# Patient Record
Sex: Male | Born: 1985 | Race: White | Hispanic: No | Marital: Single | Smoking: Never smoker
Health system: Southern US, Community
[De-identification: ages and names within clinical notes are randomized; demographics above are authoritative.]

---
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
## 2007-12-09 IMAGING — CR DG HAND COMPLETE 3+V*L*
3 series · 3 of 3 positions shown · non-contrast
Comparison: none

CLINICAL DATA: Injury.  Pain metacarpal region.
 LEFT HAND ? 3 VIEW:

[w hand pa left]
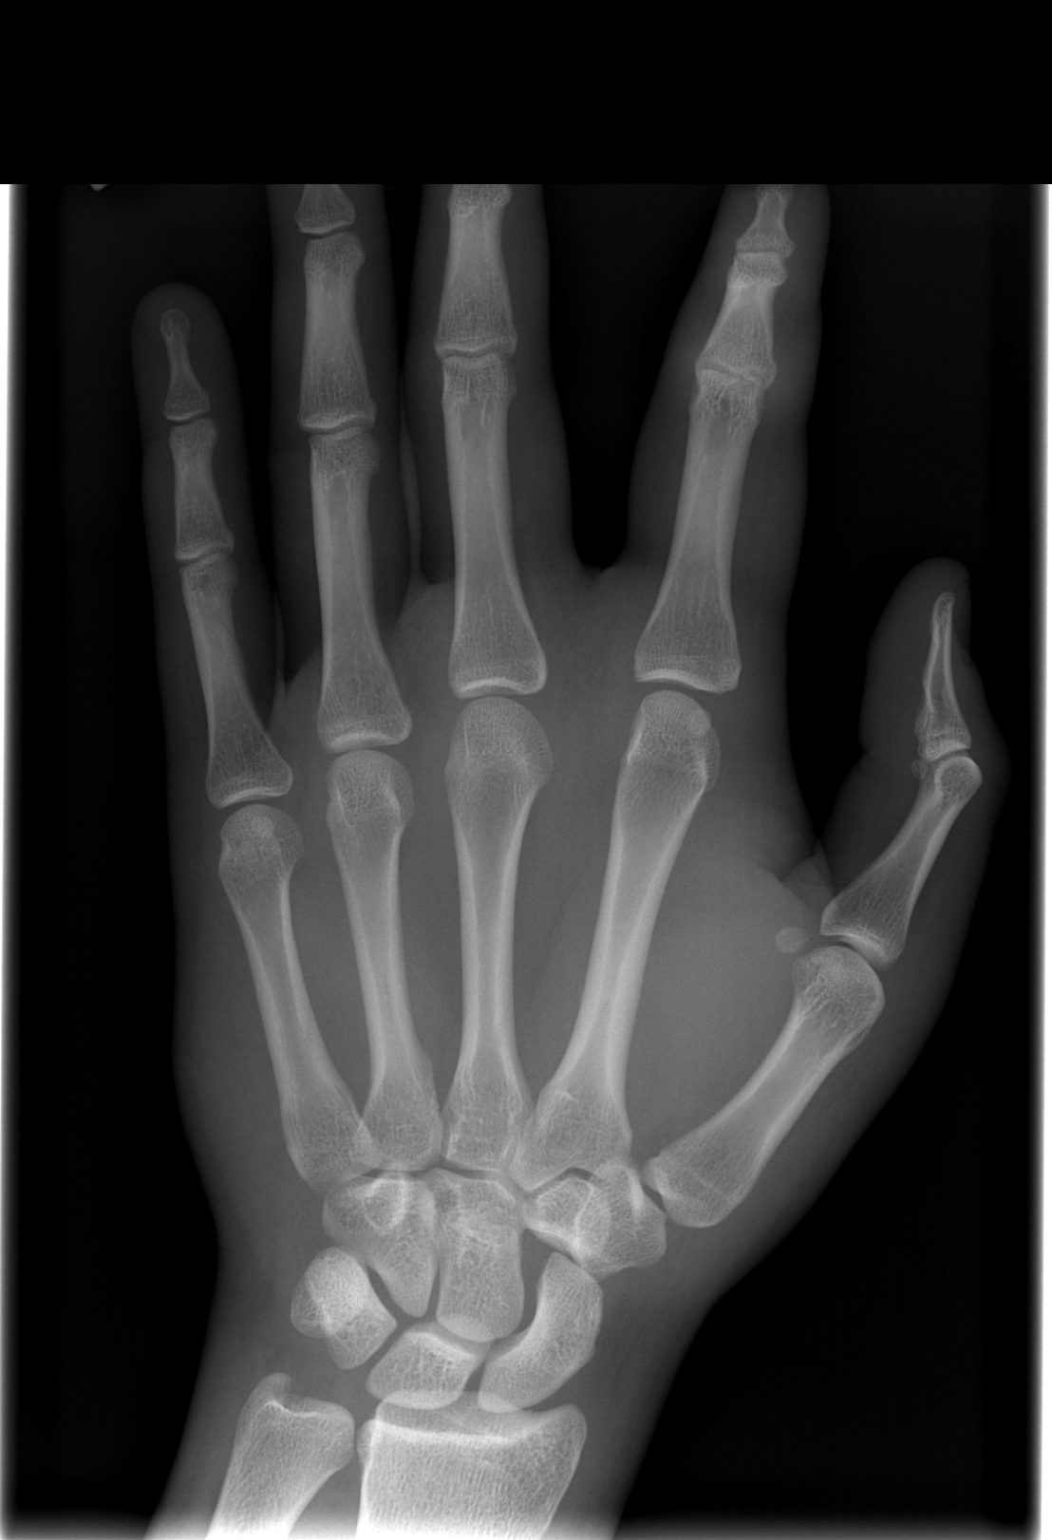

[w hand oblique left]
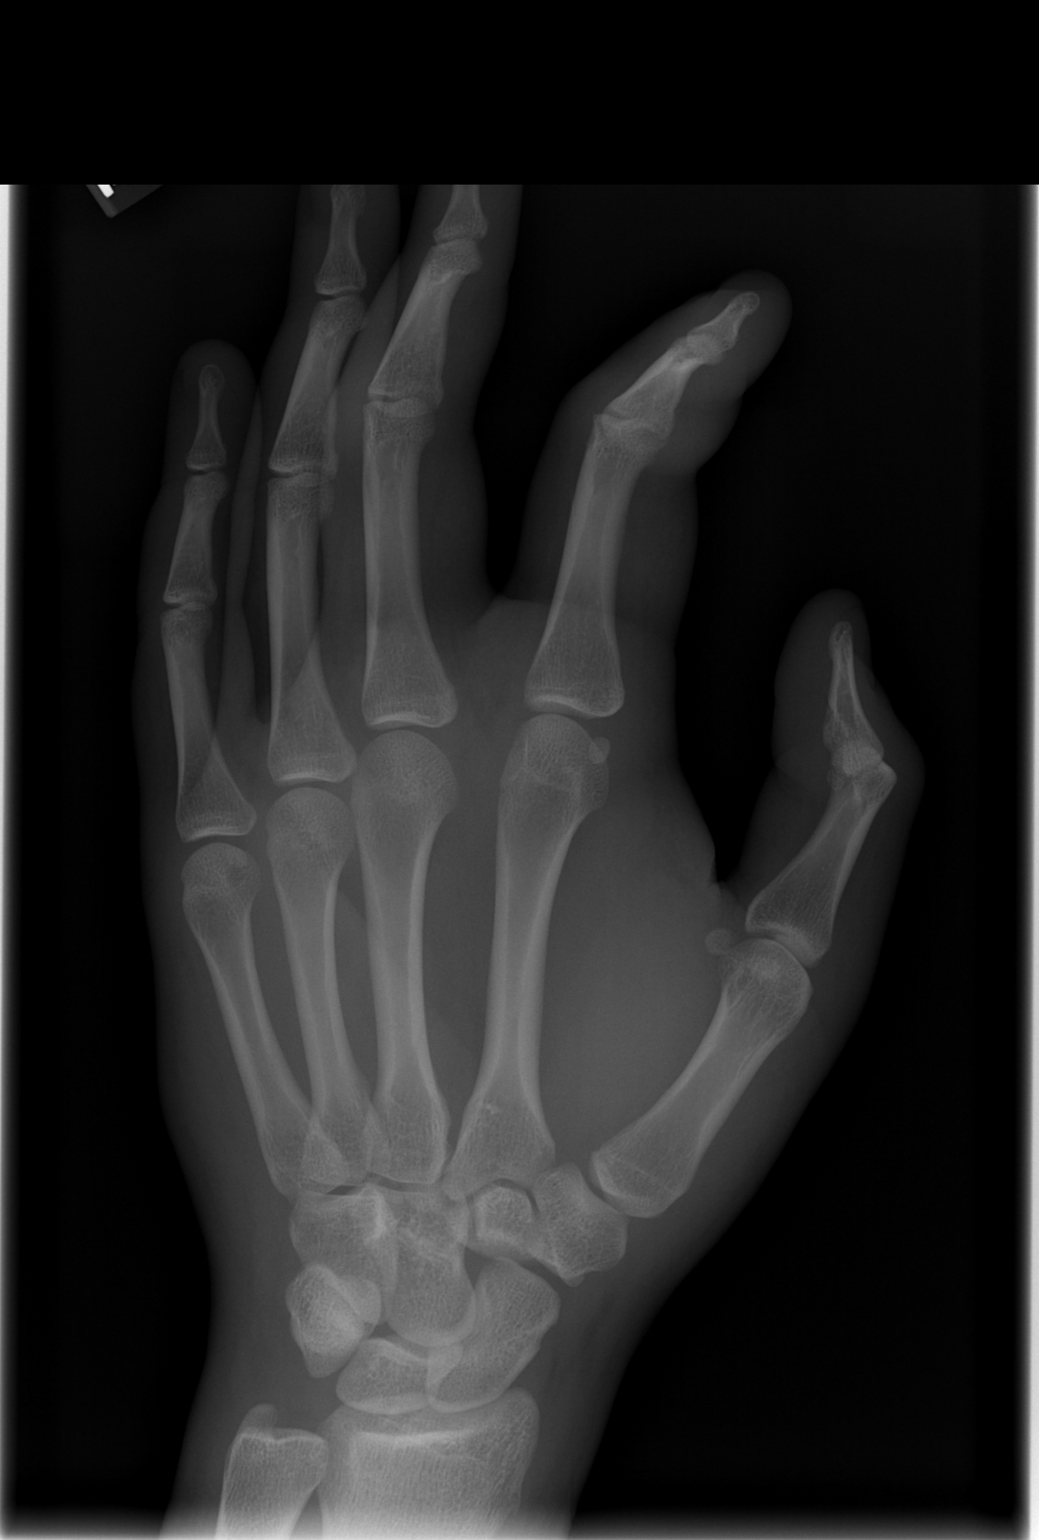

[w hand lat left]
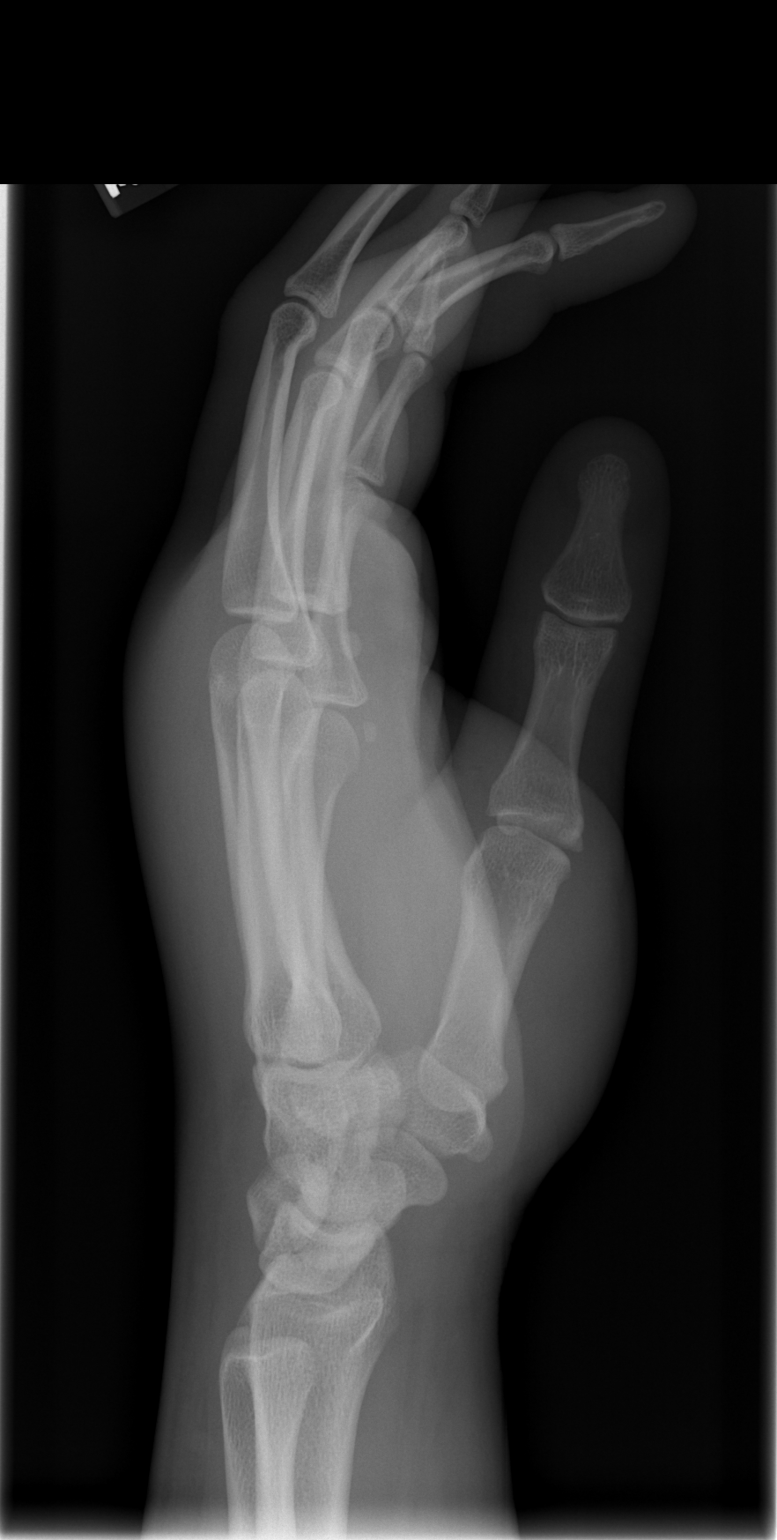

[3 of 3 positions shown; findings below may reference images not displayed]

FINDINGS: Soft tissue swelling.  No fracture or bony displacement.
IMPRESSION: No acute bony abnormality.

## 2010-09-29 ENCOUNTER — Ambulatory Visit (HOSPITAL_COMMUNITY): Payer: Self-pay | Admitting: Physician Assistant

## 2010-10-06 ENCOUNTER — Encounter (HOSPITAL_COMMUNITY): Payer: BC Managed Care – PPO | Admitting: Physician Assistant

## 2010-10-20 ENCOUNTER — Ambulatory Visit (HOSPITAL_COMMUNITY): Payer: BC Managed Care – PPO | Admitting: Physician Assistant

## 2013-09-18 ENCOUNTER — Encounter (INDEPENDENT_AMBULATORY_CARE_PROVIDER_SITE_OTHER): Payer: Self-pay

## 2013-09-18 ENCOUNTER — Encounter (HOSPITAL_COMMUNITY): Payer: Self-pay | Admitting: Psychiatry

## 2013-09-18 ENCOUNTER — Ambulatory Visit (INDEPENDENT_AMBULATORY_CARE_PROVIDER_SITE_OTHER): Payer: BC Managed Care – PPO | Admitting: Psychiatry

## 2013-09-18 VITALS — BP 129/77 | HR 77 | Ht 72.0 in | Wt 235.2 lb

## 2013-09-18 DIAGNOSIS — F3289 Other specified depressive episodes: Secondary | ICD-10-CM

## 2013-09-18 DIAGNOSIS — F329 Major depressive disorder, single episode, unspecified: Secondary | ICD-10-CM

## 2013-09-18 DIAGNOSIS — F988 Other specified behavioral and emotional disorders with onset usually occurring in childhood and adolescence: Secondary | ICD-10-CM

## 2013-09-18 DIAGNOSIS — Z79899 Other long term (current) drug therapy: Secondary | ICD-10-CM

## 2013-09-18 MED ORDER — METHYLPHENIDATE HCL ER (OSM) 18 MG PO TBCR: 18.0000 mg | EXTENDED_RELEASE_TABLET | Freq: Every day | ORAL | Status: DC

## 2013-09-18 NOTE — Progress Notes (Addendum)
[facility] Initial Assessment Note  Nicholas Sanders [id number] 28 y.o.  09/18/2013 3:49 PM  Chief Complaint:  I want to go back on my ADD medication.  I have a lot of difficulty at my work.  I used to take Concerta.  History of Present Illness:  Patient is a 28 year old Caucasian single employed man who came with his mother for his initial evaluation.  The patient has history of depression and ADD.  He is taking antidepressant for more than 4 years.  He endorsed that he has established diagnosis of ADD in high school and he was given Concerta after Vyvanse failed to improve the symptoms.  The patient recently started a new job 4 months ago.  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  He is concerned because he is unable to do multitasking .  He endorsed poor attention, poor concentration and unable to focus.  He is unable to finish his job on time.  Patient wanted to restart his Concerta.  He is also taking Prozac 60 mg as prescribed by his previous psychiatrist when he was living in [place].  Lately his Prozac as filled by his primary care physician at [facility] .  The patient had tried multiple antidepressant however he had a good response with Prozac.  He is tolerating Prozac without any side effects.  Patient denies irritability, anger, mood swings.  He denies any crying spells, feeling of hopelessness or worthlessness.  Denies any active or passive suicidal thoughts homicidal thought.  He denies any hallucination or any paranoia.  He is sleeping good.  He denies any mania, anger or any psychosis.  He admitted drinking beer sometime and smoked marijuana a few times a week.  She denies any history of physical, sexual, or bone or emotional abuse.  He denies any nightmares, flashbacks or any panic attack.  His vitals are stable.  He lives with his mother .  He has one older sister who lives in [place].  Patient is single.  Currently he is not in any relationship.  Beside  Prozac 60 mg she is not taking any medication.  Suicidal Ideation: No Plan Formed: No Patient has means to carry out plan: No  Homicidal Ideation: No Plan Formed: No Patient has means to carry out plan: No  Past Psychiatric History/Hospitalization(s) Patient endorses history of depression and ADD when he was in the school.  He did not finish college because of depression.  He likes Prozac .  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  He is diagnosed with ADD in high school.  He had tried Vyvanse however he did not have good response.  Patient has a history of mania, psychosis, hallucination, PTSD or any OCD symptoms.  His previous psychiatrist was in [place] however he has not seen more than a year.  He is getting Prozac 60 mg from his primary care physician. Anxiety: No Bipolar Disorder: No Depression: Yes Mania: No Psychosis: No Schizophrenia: No Personality Disorder: No Hospitalization for psychiatric illness: No History of Electroconvulsive Shock Therapy: No Prior Suicide Attempts: No  Medical History; The patient has no active medical problems.  He goes to [facility] for his health needs.  Traumatic brain injury: Patient denies any history of traumatic brain injury.  Family History; Patient endorse mother, father and sister has ADD.  His mother has alcohol addiction.  His uncle  has alcohol problem.  Education and Work History; Patient did not finish college because of depression.  He has worked in [employer] but did not like the third shift.  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  Psychosocial History; Patient was born in [place].  His parents are divorced.  He has one sister who lives in [place].  The patient has no contact with his father.  Patient lives with his mother.  Currently he is not in any relationship.  He has no children.  Legal  History; Patient denies any legal history.  History Of Abuse; Patient denies any history of physical, sexual, verbal or emotional abuse.  Substance Abuse History; Patient endorsed history of smoking marijuana and occasional drinking.  He denies any binge drinking, intoxication, withdrawals or any blackouts.   Review of Systems: Psychiatric: Agitation: No Hallucination: No Depressed Mood: Yes Insomnia: No Hypersomnia: No Altered Concentration: No Feels Worthless: No Grandiose Ideas: No Belief In Special Powers: No New/Increased Substance Abuse: History of smoking marijuana. Compulsions: No  Neurologic: Headache: No Seizure: No Paresthesias: No    Outpatient Encounter Prescriptions as of 09/18/2013  Medication Sig  . FLUoxetine (PROZAC) 20 MG capsule Take 60 mg by mouth daily.  . methylphenidate (CONCERTA) 18 MG PO CR tablet Take 1 tablet (18 mg total) by mouth daily.    No results found for this or any previous visit (from the past 2160 hour(s)).    Physical Exam: Constitutional:  BP 129/77  Pulse 77  Ht 6' (1.829 m)  Wt 235 lb 3.2 oz (106.686 kg)  BMI 31.89 kg/m2  Musculoskeletal: Strength & Muscle Tone: within normal limits Gait & Station: normal Patient leans: N/A  Mental Status Examination;  Patient is casually dressed and groomed.  He maintains fair eye contact.  He appears shy but cooperative.  His speech is slow, clear and coherent.  His thought process is logical and goal-directed.  He described his mood is neutral and his affect is mood appropriate.  His attention and concentration is fair.  He denies any auditory or visual hallucination.  Denies any active or passive suicidal thoughts and homicidal thought.  There were no flight of ideas or any loose association.  There were no delusions, paranoia or any obsessive thoughts.  Psychomotor activity is slightly decreased.  His fund of knowledge is average.  There were no tremors or shakes.  He is alert and  oriented x3.  His insight judgment and impulse control is okay.   New problem, with additional work up planned, Review of Psycho-Social Stressors (1), Review or order clinical lab tests (1), Decision to obtain old records (1), Review and summation of old records (2), Established Problem, Worsening (2), Review of Medication Regimen & Side Effects (2) and Review of New Medication or Change in Dosage (2)  Assessment: Axis I: Depressive disorder NOS, ADD  Axis II: Deferred  Axis III:  History reviewed. No pertinent past medical history.  Axis IV: Mild to moderate    Plan:  I reviewed the symptoms, collateral information, stressors, his current medication.  Patient has a good response with Concerta in the past.  He is taking Prozac 60 mg as prescribed by his previous psychiatrist that he was in [place].  We will get collateral information from his previous psychiatrist.  I discussed about his marijuana use and drinking.  Patient is willing to stop smoking marijuana and drinking alcohol .  He also get better and he wants to keep his job safe .  I also discuss starting Wellbutrin that can help depression and ADD symptoms the patient does not want to change his Prozac since he had tried other antidepressants with limited response.  He has enough refills of Prozac which is prescribed lately by his primary care physician at [facility].  I will try low dose Concerta 18 mg daily.  Discuss in detail the risks and benefits of medication.  Explained about weight loss, chest pain, palpitation and insomnia.  Discussed about stopping marijuana and alcohol .  I would also do blood work says he has no blood work in a while.  He will order CBC, CMP, and latency and lipid panel.  At this time patient does not require any counseling.  I will see him again in 2-3 weeks.  Recommended to call us back if he has any question or any concern.  Time spent 55 minutes.  More than 50% of the time spent in  psychoeducation, counseling and coordination of care.  Discuss safety plan that anytime having active suicidal thoughts or homicidal thoughts then patient need to call 911 or go to the local emergency room.   Keyanna Sandefer T., MD 09/18/2013

## 2013-10-08 LAB — CBC WITH DIFFERENTIAL/PLATELET
BASOS ABS: 0.1 10*3/uL (ref 0.0–0.1)
Basophils Relative: 1 % (ref 0–1)
Eosinophils Absolute: 0.2 10*3/uL (ref 0.0–0.7)
Eosinophils Relative: 2 % (ref 0–5)
HEMATOCRIT: 46.4 % (ref 39.0–52.0)
Hemoglobin: 16.5 g/dL (ref 13.0–17.0)
Lymphocytes Relative: 40 % (ref 12–46)
Lymphs Abs: 3.3 10*3/uL (ref 0.7–4.0)
MCH: 31.9 pg (ref 26.0–34.0)
MCHC: 35.6 g/dL (ref 30.0–36.0)
MCV: 89.6 fL (ref 78.0–100.0)
MONOS PCT: 6 % (ref 3–12)
Monocytes Absolute: 0.5 10*3/uL (ref 0.1–1.0)
NEUTROS ABS: 4.2 10*3/uL (ref 1.7–7.7)
NEUTROS PCT: 51 % (ref 43–77)
PLATELETS: 296 10*3/uL (ref 150–400)
RBC: 5.18 MIL/uL (ref 4.22–5.81)
RDW: 13.1 % (ref 11.5–15.5)
WBC: 8.3 10*3/uL (ref 4.0–10.5)

## 2013-10-09 ENCOUNTER — Ambulatory Visit (HOSPITAL_COMMUNITY): Payer: Self-pay | Admitting: Psychiatry

## 2013-10-09 LAB — COMPREHENSIVE METABOLIC PANEL
ALT: 31 U/L (ref 0–53)
AST: 25 U/L (ref 0–37)
Albumin: 5.2 g/dL (ref 3.5–5.2)
Alkaline Phosphatase: 51 U/L (ref 39–117)
BUN: 12 mg/dL (ref 6–23)
CHLORIDE: 102 meq/L (ref 96–112)
CO2: 31 meq/L (ref 19–32)
Calcium: 10.2 mg/dL (ref 8.4–10.5)
Creat: 0.87 mg/dL (ref 0.50–1.35)
GLUCOSE: 75 mg/dL (ref 70–99)
POTASSIUM: 4.1 meq/L (ref 3.5–5.3)
SODIUM: 140 meq/L (ref 135–145)
Total Bilirubin: 0.5 mg/dL (ref 0.2–1.2)
Total Protein: 7.6 g/dL (ref 6.0–8.3)

## 2013-10-09 LAB — LIPID PANEL
CHOL/HDL RATIO: 3.8 ratio
Cholesterol: 200 mg/dL (ref 0–200)
HDL: 53 mg/dL (ref >39)
LDL Cholesterol: 120 mg/dL — ABNORMAL HIGH (ref 0–99)
Triglycerides: 137 mg/dL (ref <150)
VLDL: 27 mg/dL (ref 0–40)

## 2013-10-09 LAB — HEMOGLOBIN A1C
HEMOGLOBIN A1C: 5.5 % (ref <5.7)
Mean Plasma Glucose: 111 mg/dL (ref <117)

## 2013-10-22 ENCOUNTER — Telehealth (HOSPITAL_COMMUNITY): Payer: Self-pay

## 2013-10-22 ENCOUNTER — Telehealth (HOSPITAL_COMMUNITY): Payer: Self-pay | Admitting: *Deleted

## 2013-10-22 ENCOUNTER — Other Ambulatory Visit (HOSPITAL_COMMUNITY): Payer: Self-pay | Admitting: Psychiatry

## 2013-10-22 DIAGNOSIS — F988 Other specified behavioral and emotional disorders with onset usually occurring in childhood and adolescence: Secondary | ICD-10-CM

## 2013-10-22 MED ORDER — METHYLPHENIDATE HCL ER (OSM) 18 MG PO TBCR: 18.0000 mg | EXTENDED_RELEASE_TABLET | Freq: Every day | ORAL | Status: DC

## 2013-10-22 NOTE — Progress Notes (Signed)
Provide one time prescription of stimulant.  Patient needs to be seen for future refills.

## 2013-10-22 NOTE — Telephone Encounter (Signed)
Requested refill of Concerta. Advised pt that request will be sent to [facility] Encouraged pt to make appt - only one appt 8/6 without follow up Patient stated he came on 8/27, but office had closed early due to an emergency. States he will call today

## 2013-11-17 ENCOUNTER — Encounter (HOSPITAL_COMMUNITY): Payer: Self-pay | Admitting: Psychiatry

## 2013-11-17 ENCOUNTER — Ambulatory Visit (INDEPENDENT_AMBULATORY_CARE_PROVIDER_SITE_OTHER): Payer: BC Managed Care – PPO | Admitting: Psychiatry

## 2013-11-17 VITALS — BP 139/80 | HR 85 | Ht 72.0 in | Wt 248.0 lb

## 2013-11-17 DIAGNOSIS — F988 Other specified behavioral and emotional disorders with onset usually occurring in childhood and adolescence: Secondary | ICD-10-CM

## 2013-11-17 DIAGNOSIS — F329 Major depressive disorder, single episode, unspecified: Secondary | ICD-10-CM

## 2013-11-17 DIAGNOSIS — F909 Attention-deficit hyperactivity disorder, unspecified type: Secondary | ICD-10-CM

## 2013-11-17 MED ORDER — METHYLPHENIDATE HCL ER (OSM) 36 MG PO TBCR: 36.0000 mg | EXTENDED_RELEASE_TABLET | Freq: Every day | ORAL | Status: DC

## 2013-11-17 NOTE — Progress Notes (Signed)
[facility] Progress Note   Nicholas Sanders [id number] 27 y.o.  11/17/2013 4:11 PM  Chief Complaint:  I'm taking 2 tablets of Concerta because one is not helping my focus.  I was given warning at work because I am neglecting my job.   History of Present Illness:  Nicholas Sanders came for her followup appointment.  He is a 28 year old Caucasian single employed man who was seen as a need to evaluation on August 6.  He has a history of ADHD.  He was started on Concerta 18 mg.  After 2 weeks he felt it is not helping him and he started to take 2 tablets every day.  He admitted using his mother's Concerta .  He was very concerned because he was neglecting and he was unable to do multitasking at work.  He was given warning from the job .  Since he is taking Concerta 36 mg his focus and attention is good.  He is able to finish his tasks on time.  He is able to do multitasking.  His sleep is good.  Denies any irritability, anger, mood swing.  He denies any chest pain or any tremors.  He cut down his drinking and he has smoked marijuana only once since he was seen last time.  His energy level is good.  Denies any crying spells, paranoia or any hallucination.  He has a blood work which is normal except for mild elevation of LDL.  He is taking Prozac 60 mg as prescribed by his previous psychiatrist and he has enough refills remaining.  His appetite is okay.  His vitals are stable.  He lives with his mother.  He likes his job.  He is working at [employer]   Suicidal Ideation: No Plan Formed: No Patient has means to carry out plan: No  Homicidal Ideation: No Plan Formed: No Patient has means to carry out plan: No  Past Psychiatric History/Hospitalization(s) Patient endorses history of depression and ADD when he was in the school.  He did not finish college because of depression.  He likes Prozac .  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  He is diagnosed with ADD in high school.  He had tried Vyvanse however he did not have good response.  Patient has a history of mania, psychosis, hallucination, PTSD or any OCD symptoms.  His previous psychiatrist was in [place] however he has not seen more than a year.  He is getting Prozac 60 mg from his primary care physician. Anxiety: No Bipolar Disorder: No Depression: Yes Mania: No Psychosis: No Schizophrenia: No Personality Disorder: No Hospitalization for psychiatric illness: No History of Electroconvulsive Shock Therapy: No Prior Suicide Attempts: No  Medical History; Patient has no active medical problems.  He goes to [facility] for his [facility] needs.  Review of Systems  Constitutional: Negative.   Musculoskeletal: Negative.   Skin: Negative.   Neurological: Negative.    Psychiatric: Agitation: No Hallucination: No Depressed Mood: No Insomnia: No Hypersomnia: No Altered Concentration: No Feels Worthless: No Grandiose Ideas: No Belief In Special Powers: No New/Increased Substance Abuse: History of smoking marijuana. Compulsions: No  Neurologic: Headache: No Seizure: No Paresthesias: No    Outpatient Encounter Prescriptions as of 11/17/2013  Medication Sig  . FLUoxetine (PROZAC) 20 MG capsule Take 60 mg by mouth daily.  . methylphenidate 36 MG PO CR tablet  Take 1 tablet (36 mg total) by mouth daily.  . [DISCONTINUED] methylphenidate (CONCERTA) 18 MG PO CR tablet Take 1 tablet (18 mg total) by mouth daily.  . [DISCONTINUED] methylphenidate (CONCERTA) 36 MG PO CR tablet Take 1 tablet (36 mg total) by mouth daily.    Recent Results (from the past 2160 hour(s))  HEMOGLOBIN A1C     Status: None   Collection Time    10/08/13  5:16 PM      Result Value Ref Range   Hemoglobin A1C 5.5  <5.7 %   Comment:                                                                            According to the ADA Clinical  Practice Recommendations for 2011, when     HbA1c is used as a screening test:             >=6.5%   Diagnostic of Diabetes Mellitus                (if abnormal result is confirmed)           5.7-6.4%   Increased risk of developing Diabetes Mellitus           References:Diagnosis and Classification of Diabetes Mellitus,Diabetes     YDXA,1287,86([facility] 1):S62-S69 and Standards of Medical Care in             Diabetes - 2011,Diabetes Care,2011,34 (Suppl 1):S11-S61.         Mean Plasma Glucose 111  <117 mg/dL  LIPID PANEL     Status: Abnormal   Collection Time    10/08/13  5:16 PM      Result Value Ref Range   Cholesterol 200  0 - 200 mg/dL   Comment: ATP III Classification:           < 200        mg/dL        Desirable          200 - 239     mg/dL        Borderline High          >= 240        mg/dL        High         Triglycerides 137  <150 mg/dL   HDL 53  >39 mg/dL   Total CHOL/HDL Ratio 3.8     VLDL 27  0 - 40 mg/dL   LDL Cholesterol 120 (*) 0 - 99 mg/dL   Comment:       Total Cholesterol/HDL Ratio:CHD Risk                            Coronary Heart Disease Risk Table                                            Men       Women              1/2 Average Risk  3.4        3.3                  Average Risk              5.0        4.4               2X Average Risk              9.6        7.1               3X Average Risk             23.4       11.0     Use the calculated Patient Ratio above and the CHD Risk table      to determine the patient's CHD Risk.     ATP III Classification (LDL):           < 100        mg/dL         Optimal          100 - 129     mg/dL         Near or Above Optimal          130 - 159     mg/dL         Borderline High          160 - 189     mg/dL         High           > 190        mg/dL         Very High        CBC WITH DIFFERENTIAL     Status: None   Collection Time    10/08/13  5:16 PM      Result Value Ref Range   WBC 8.3  4.0 - 10.5 K/uL    RBC 5.18  4.22 - 5.81 MIL/uL   Hemoglobin 16.5  13.0 - 17.0 g/dL   HCT 46.4  39.0 - 52.0 %   MCV 89.6  78.0 - 100.0 fL   MCH 31.9  26.0 - 34.0 pg   MCHC 35.6  30.0 - 36.0 g/dL   RDW 13.1  11.5 - 15.5 %   Platelets 296  150 - 400 K/uL   Neutrophils Relative % 51  43 - 77 %   Neutro Abs 4.2  1.7 - 7.7 K/uL   Lymphocytes Relative 40  12 - 46 %   Lymphs Abs 3.3  0.7 - 4.0 K/uL   Monocytes Relative 6  3 - 12 %   Monocytes Absolute 0.5  0.1 - 1.0 K/uL   Eosinophils Relative 2  0 - 5 %   Eosinophils Absolute 0.2  0.0 - 0.7 K/uL   Basophils Relative 1  0 - 1 %   Basophils Absolute 0.1  0.0 - 0.1 K/uL   Smear Review Criteria for review not met    COMPREHENSIVE METABOLIC PANEL     Status: None   Collection Time    10/08/13  5:16 PM      Result Value Ref Range   Sodium 140  135 - 145 mEq/L   Potassium 4.1  3.5 - 5.3 mEq/L   Chloride 102  96 - 112 mEq/L   CO2 31  19 - 32 mEq/L   Glucose,  Bld 75  70 - 99 mg/dL   BUN 12  6 - 23 mg/dL   Creat 0.87  0.50 - 1.35 mg/dL   Total Bilirubin 0.5  0.2 - 1.2 mg/dL   Alkaline Phosphatase 51  39 - 117 U/L   AST 25  0 - 37 U/L   ALT 31  0 - 53 U/L   Total Protein 7.6  6.0 - 8.3 g/dL   Albumin 5.2  3.5 - 5.2 g/dL   Calcium 10.2  8.4 - 10.5 mg/dL      Physical Exam: Constitutional:  BP 139/80  Pulse 85  Ht 6' (1.829 m)  Wt 248 lb (112.492 kg)  BMI 33.63 kg/m2  Musculoskeletal: Strength & Muscle Tone: within normal limits Gait & Station: normal Patient leans: N/A  Mental Status Examination;  Patient is casually dressed and groomed.  He maintains fair eye contact.  He appears shy but cooperative.  His speech is slow, clear and coherent.  His thought process is logical and goal-directed.  He described his mood is neutral and his affect is mood appropriate.  His attention and concentration is fair.  He denies any auditory or visual hallucination.  Denies any active or passive suicidal thoughts and homicidal thought.  There were no flight of  ideas or any loose association.  There were no delusions, paranoia or any obsessive thoughts.  Psychomotor activity is slightly decreased.  His fund of knowledge is average.  There were no tremors or shakes.  He is alert and oriented x3.  His insight judgment and impulse control is okay.   Review of Psycho-Social Stressors (1), Review or order clinical lab tests (1), Review of Last Therapy Session (1), Review of Medication Regimen & Side Effects (2) and Review of New Medication or Change in Dosage (2)  Assessment: Axis I: Depressive disorder NOS, ADD  Axis II: Deferred  Axis III:  History reviewed. No pertinent past medical history.  Axis IV: Mild to moderate    Plan:  I discussed medication, labs results in detail.  Recommended not to use someone's medication.  He is doing better on Concerta 36 mg.  I will continue Concerta 36 mg daily.  Patient stopped drinking and has cut down his marijuana use.  His attention and focus is improved from the past.  Continue Prozac 60 mg daily.  Patient has 3 children maintenance.  We are still waiting collateral information from his previous psychiatrist and medical doctor from [facility].  Recommended to call us back if he has any question or any concern.  Followup in 2 months. Time spent 25 minutes.  More than 50% of the time spent in psychoeducation, counseling and coordination of care.  Discuss safety plan that anytime having active suicidal thoughts or homicidal thoughts then patient need to call 911 or go to the local emergency room.   ARFEEN,SYED T., MD 11/17/2013

## 2014-01-19 ENCOUNTER — Ambulatory Visit (HOSPITAL_COMMUNITY): Payer: Self-pay | Admitting: Psychiatry

## 2014-03-11 ENCOUNTER — Telehealth (HOSPITAL_COMMUNITY): Payer: Self-pay | Admitting: *Deleted

## 2014-03-11 NOTE — Telephone Encounter (Signed)
Patient called needing refill on Concerta. Patient no showed appointment in December. Made patient follow up appointment for 03-16-2014 at 1045 am. Patient stated he has enough medication for a week as of today, so next Wednesday he will be out(03-18-2014). Advised patient to keep appointment on 03-16-2014 so he can receive refill on medication. Patient verbalized understanding.

## 2014-03-16 ENCOUNTER — Ambulatory Visit (INDEPENDENT_AMBULATORY_CARE_PROVIDER_SITE_OTHER): Payer: 59 | Admitting: Psychiatry

## 2014-03-16 ENCOUNTER — Encounter (HOSPITAL_COMMUNITY): Payer: Self-pay | Admitting: Psychiatry

## 2014-03-16 VITALS — BP 148/96 | HR 76 | Ht 72.0 in | Wt 256.0 lb

## 2014-03-16 DIAGNOSIS — F329 Major depressive disorder, single episode, unspecified: Secondary | ICD-10-CM

## 2014-03-16 DIAGNOSIS — F909 Attention-deficit hyperactivity disorder, unspecified type: Secondary | ICD-10-CM

## 2014-03-16 DIAGNOSIS — F988 Other specified behavioral and emotional disorders with onset usually occurring in childhood and adolescence: Secondary | ICD-10-CM

## 2014-03-16 MED ORDER — METHYLPHENIDATE HCL ER (OSM) 36 MG PO TBCR: 36.0000 mg | EXTENDED_RELEASE_TABLET | Freq: Every day | ORAL | Status: DC

## 2014-03-16 NOTE — Progress Notes (Signed)
[facility] [address] Progress Note   Nicholas Sanders [id number] 29 y.o.  03/16/2014 11:37 AM  Chief Complaint:  Medication management and follow-up.     History of Present Illness:  Nicholas Sanders came for her followup appointment.  He is compliant with Concerta 36 mg.  Nicholas attention and concentration is good.  Recently he was appreciated at Nicholas work as he is more focus and able to do multitasking.  However patient is concerned about Nicholas Sanders who has history of drinking alcohol and he is concerned that she may be using Xanax .  Patient told Nicholas Sanders has attended [facility] in the past and wondering if she may need help.  Overall patient has been stable on Nicholas medication.  He denies any irritability, anger, mood swing or any crying spells.  He sleeping good.  He denies any paranoia or any hallucination.  He is taking Prozac 60 mg which is prescribed by Nicholas previous psychiatrist and he still has refill remaining.  We are still awaiting records from [facility].  He reported Nicholas appetite is okay.  However he has gained weight from the past.  Nicholas vitals are stable.  Patient lives with Nicholas Sanders.  He likes Nicholas job.  He is working at [employer].  He denies drinking or using any illegal substances.  Suicidal Ideation: No Plan Formed: No Patient has means to carry out plan: No  Homicidal Ideation: No Plan Formed: No Patient has means to carry out plan: No  Past Psychiatric History/Hospitalization(s) Patient endorses history of depression and ADD when he was in the school.  He did not finish college because of depression.  He likes Prozac .  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  He is diagnosed with ADD in high school.  He had tried Vyvanse however he did not have good response.  Patient has a history of mania, psychosis, hallucination, PTSD or any OCD symptoms.  Nicholas previous psychiatrist  was in [place] however he has not seen more than a year.  He is getting Prozac 60 mg from Nicholas primary care physician. Anxiety: No Bipolar Disorder: No Depression: Yes Mania: No Psychosis: No Schizophrenia: No Personality Disorder: No Hospitalization for psychiatric illness: No History of Electroconvulsive Shock Therapy: No Prior Suicide Attempts: No  Medical History; Patient has no active medical problems.  He goes to [facility] for Nicholas health needs.  ROS Psychiatric: Agitation: No Hallucination: No Depressed Mood: No Insomnia: No Hypersomnia: No Altered Concentration: No Feels Worthless: No Grandiose Ideas: No Belief In Special Powers: No New/Increased Substance Abuse: History of smoking marijuana. Compulsions: No  Neurologic: Headache: No Seizure: No Paresthesias: No    Outpatient Encounter Prescriptions as of 03/16/2014  Medication Sig  . FLUoxetine (PROZAC) 20 MG capsule Take 60 mg by mouth daily.  . methylphenidate 36 MG PO CR tablet Take 1 tablet (36 mg total) by mouth daily.  . [DISCONTINUED] methylphenidate 36 MG PO CR tablet Take 1 tablet (36 mg total) by mouth daily.  . [DISCONTINUED] methylphenidate 36 MG PO CR tablet Take 1 tablet (36 mg total) by mouth daily.    No results found for this or any previous visit (from the past 2160 hour(s)).    Physical Exam: Constitutional:  BP 148/96 mmHg  Pulse 76  Ht 6' (1.829 m)  Wt 256 lb (116.121 kg)  BMI 34.71 kg/m2  Musculoskeletal: Strength & Muscle Tone: within normal limits Gait & Station: normal Patient leans: N/A  Mental Status Examination;  Patient is casually dressed and groomed.  He maintained good eye contact.  He is pleasant and cooperative.  Nicholas speech is slow, clear and coherent.  Nicholas thought process is logical and goal-directed.  He described Nicholas mood is neutral and Nicholas affect is mood appropriate.  Nicholas attention and concentration is fair.  He denies any auditory or visual  hallucination.  He denies any active or passive suicidal thoughts and homicidal thought.  There were no flight of ideas or any loose association.  There were no delusions, paranoia or any obsessive thoughts.  Psychomotor activity is slightly decreased.  Nicholas fund of knowledge is average.  There were no tremors or shakes.  He is alert and oriented x3.  Nicholas insight judgment and impulse control is okay.   Established Problem, Stable/Improving (1), Review of Psycho-Social Stressors (1), Review of Last Therapy Session (1) and Review of Medication Regimen & Side Effects (2)  Assessment: Axis I: Depressive disorder NOS, ADD  Axis II: Deferred  Axis III:  History reviewed. No pertinent past medical history.  Plan:  I recommended to have Nicholas Sanders called outpatient office to be evaluated or if patient believed that Sanders is in crisis and risk to herself or others then he should call 911 and to be evaluated in the emergency room.  I also talk about getting commitment for outpatient program if needed.  Patient knowledge.  At this time I will continue Concerta 60 mg daily .  Patient is getting Prozac 60 mg from Nicholas previous psychiatrist since he still has refill remaining however in the future he will contact us for refills.  Discussed medication side effects and benefits.  He is happy that he is no longer using drugs and marijuana.  I will see him again in 3 months.  Recommended to call us back if he has any question, concern or if he feel worsening of the symptoms.   Christobal Morado T., MD 03/16/2014

## 2014-03-19 ENCOUNTER — Telehealth (HOSPITAL_COMMUNITY): Payer: Self-pay

## 2014-03-19 NOTE — Telephone Encounter (Signed)
Prior authorization started for Concerta medication.  Fax will be sent today to complete.

## 2014-03-19 NOTE — Telephone Encounter (Signed)
Patient approved for Concerta through [facility] today from 03/19/14 - 03/20/15.  File ID#: [id number].

## 2014-03-19 NOTE — Telephone Encounter (Signed)
Prior authorization completed and faxed back to [facility].  Attempted to contact patient by phone but home listed number has been disconnected.  Could not reach patient at other numbers as was going to assess any other stimulants patient had tried and failed in the past.  By initial evaluation 09/18/13, patient did report failed attempt on Vyvanse when he was a teenager and included this on the request.  Prior authorization faxed in and will continue to contact patient for other failed medications if authorization is denied.

## 2014-04-06 ENCOUNTER — Other Ambulatory Visit (HOSPITAL_COMMUNITY): Payer: Self-pay

## 2014-04-06 DIAGNOSIS — F32A Depression, unspecified: Secondary | ICD-10-CM

## 2014-04-06 DIAGNOSIS — F329 Major depressive disorder, single episode, unspecified: Secondary | ICD-10-CM

## 2014-04-06 MED ORDER — FLUOXETINE HCL 20 MG PO CAPS: 60.0000 mg | ORAL_CAPSULE | Freq: Every day | ORAL | Status: DC

## 2014-04-06 NOTE — Telephone Encounter (Signed)
Patient called requesting an order for his Prozac 20mg , 3 a day to be sent into [facility] on [address] [facility] [address]  Patient reported past psychiatrist from [place], Dr. Nils FlackMartha Smith was no longer prescribing his Prozac and stated Dr. Lolly MustacheArfeen mentioned at his evaluation 03/16/14 he would be willing to take over this prescription.  Patient would like a call back to let him know Dr. Lolly MustacheArfeen is still in agreement to write the new prescription and is completely out as of today.

## 2014-04-06 NOTE — Telephone Encounter (Signed)
Called patient back to [facility] [address] and e-scribed in a new order, Prozac 20mg , 3 daily, #90 with 2 refills to last patient until he returns 06/15/14 and new orders authorized by Dr. Lolly MustacheArfeen with verbal order today.

## 2014-06-15 ENCOUNTER — Ambulatory Visit (INDEPENDENT_AMBULATORY_CARE_PROVIDER_SITE_OTHER): Payer: 59 | Admitting: Psychiatry

## 2014-06-15 ENCOUNTER — Encounter (HOSPITAL_COMMUNITY): Payer: Self-pay | Admitting: Psychiatry

## 2014-06-15 VITALS — BP 126/86 | HR 71 | Ht 72.0 in | Wt 256.8 lb

## 2014-06-15 DIAGNOSIS — F329 Major depressive disorder, single episode, unspecified: Secondary | ICD-10-CM

## 2014-06-15 DIAGNOSIS — F909 Attention-deficit hyperactivity disorder, unspecified type: Secondary | ICD-10-CM

## 2014-06-15 DIAGNOSIS — F988 Other specified behavioral and emotional disorders with onset usually occurring in childhood and adolescence: Secondary | ICD-10-CM

## 2014-06-15 DIAGNOSIS — F32A Depression, unspecified: Secondary | ICD-10-CM

## 2014-06-15 MED ORDER — METHYLPHENIDATE HCL ER (OSM) 36 MG PO TBCR: 36.0000 mg | EXTENDED_RELEASE_TABLET | Freq: Every day | ORAL | Status: DC

## 2014-06-15 MED ORDER — FLUOXETINE HCL 20 MG PO CAPS: 60.0000 mg | ORAL_CAPSULE | Freq: Every day | ORAL | Status: DC

## 2014-06-15 NOTE — Progress Notes (Signed)
[facility] [address] Progress Note   Nicholas Sanders [id number] 29 y.o.  06/15/2014 4:53 PM  Chief Complaint:  Medication management and follow-up.     History of Present Illness:  Nicholas Sanders came for her followup appointment.   He is very happy because he got promoted at his work.  He is taking his medication and he has no side effects.  He sleeping good.  He denies any irritability, anger, mood swing or any shakes or tremors.  He is happy that his mother is not drinking alcohol anymore.  His been very busy at work.  His appetite is okay.  His vitals are stable.  He denies any feeling of hopelessness or worthlessness.  He is taking Concerta 36 mg daily.  He is taking Prozac 60 mg daily.  Patient lives with his mother.  He likes his job.  He is working at [employer].  He denies drinking or using any illegal substances.  Suicidal Ideation: No Plan Formed: No Patient has means to carry out plan: No  Homicidal Ideation: No Plan Formed: No Patient has means to carry out plan: No  Past Psychiatric History/Hospitalization(s) Patient endorses history of depression and ADD when he was in the school.  He did not finish college because of depression.   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  He is diagnosed with ADD in high school.  He had tried Vyvanse however he did not have good response.  Patient denies history of mania, psychosis, hallucination, PTSD or any OCD symptoms.   He had a good response with Prozac. Anxiety: No Bipolar Disorder: No Depression: Yes Mania: No Psychosis: No Schizophrenia: No Personality Disorder: No Hospitalization for psychiatric illness: No History of Electroconvulsive Shock Therapy: No Prior Suicide Attempts: No  Medical History; Patient has no active medical problems.  He goes [facility] his health needs.  ROS Psychiatric: Agitation:  No Hallucination: No Depressed Mood: No Insomnia: No Hypersomnia: No Altered Concentration: No Feels Worthless: No Grandiose Ideas: No Belief In Special Powers: No New/Increased Substance Abuse: History of smoking marijuana. Compulsions: No  Neurologic: Headache: No Seizure: No Paresthesias: No    Outpatient Encounter Prescriptions as of 06/15/2014  . Order #: [id number] Class: Normal  . Order #: [id number] Class: Print  . [DISCONTINUED] Order #: [id number] Class: Normal  . [DISCONTINUED] Order #: [id number] Class: Print  . [DISCONTINUED] Order #: [id number] Class: Print    No results found for this or any previous visit (from the past 2160 hour(s)).    Physical Exam: Constitutional:  BP 126/86 mmHg  Pulse 71  Ht 6' (1.829 m)  Wt 256 lb 12.8 oz (116.484 kg)  BMI 34.82 kg/m2  Musculoskeletal: Strength & Muscle Tone: within normal limits Gait & Station: normal Patient leans: N/A  Mental Status Examination;  Patient is casually dressed and groomed.  He maintained good eye contact.  He is pleasant and cooperative.  His speech is clear and coherent.  His thought process is logical and goal-directed.  He described his mood is neutral and his affect is mood appropriate.  His attention and concentration is fair.  He denies any auditory or visual hallucination.  He denies any active or passive suicidal thoughts and homicidal thought.  There were no flight of ideas or any loose association.  There were no delusions, paranoia or any obsessive thoughts.  Psychomotor activity is  slightly decreased.  His fund of knowledge is average.  There were no tremors or shakes.  He is alert and oriented x3.  His insight judgment and impulse control is okay.   Established Problem, Stable/Improving (1), Review of Psycho-Social Stressors (1), Review of Last Therapy Session (1) and Review of Medication Regimen & Side Effects (2)  Assessment: Axis I: Depressive disorder NOS, ADD  Axis II: Deferred  Axis  III:  History reviewed. No pertinent past medical history.  Plan:  Patient is doing better on Prozac  60 mg daily and Concerta 36 mg daily.  He has no side effects.  Discussed medication side effects and benefits. I commended to call us back if he has any question or any concern. Follow-up in 3 months.  Isha Seefeld T., MD 06/15/2014

## 2014-09-15 ENCOUNTER — Encounter (HOSPITAL_COMMUNITY): Payer: Self-pay | Admitting: Psychiatry

## 2014-09-15 ENCOUNTER — Ambulatory Visit (INDEPENDENT_AMBULATORY_CARE_PROVIDER_SITE_OTHER): Payer: 59 | Admitting: Psychiatry

## 2014-09-15 VITALS — BP 141/73 | HR 75 | Ht 72.0 in | Wt 255.6 lb

## 2014-09-15 DIAGNOSIS — F329 Major depressive disorder, single episode, unspecified: Secondary | ICD-10-CM

## 2014-09-15 DIAGNOSIS — F32A Depression, unspecified: Secondary | ICD-10-CM

## 2014-09-15 DIAGNOSIS — F909 Attention-deficit hyperactivity disorder, unspecified type: Secondary | ICD-10-CM

## 2014-09-15 DIAGNOSIS — F988 Other specified behavioral and emotional disorders with onset usually occurring in childhood and adolescence: Secondary | ICD-10-CM

## 2014-09-15 MED ORDER — METHYLPHENIDATE HCL ER (OSM) 36 MG PO TBCR: 36.0000 mg | EXTENDED_RELEASE_TABLET | Freq: Every day | ORAL | Status: DC

## 2014-09-15 MED ORDER — FLUOXETINE HCL 20 MG PO CAPS: 60.0000 mg | ORAL_CAPSULE | Freq: Every day | ORAL | Status: DC

## 2014-09-15 MED ORDER — HYDROXYZINE PAMOATE 25 MG PO CAPS: 25.0000 mg | ORAL_CAPSULE | Freq: Three times a day (TID) | ORAL | Status: DC | PRN

## 2014-09-15 NOTE — Progress Notes (Signed)
[facility] [address] Progress Note   Nicholas Sanders [id number] 29 y.o.  09/15/2014 4:30 PM  Chief Complaint:  I am stress at work.  Some nights I cannot sleep.       History of Present Illness:  Nicholas Sanders came for her followup appointment.   His been complaining of increased stress at work.  He got promoted but he felt increased responsibility and feeling overwhelmed.  There are nights that he could not sleep because he is thinking about his job.  His been looking around to find a different job so far he has not heard anything.  He wants to continue his current medication.  He is able to do multitasking but sometime he feel job is very stressful.  He is requesting something to help sleep.  He denies any agitation, anger, mood swing.  He denies any active or passive suicidal thoughts.  His appetite is okay.  His vitals are stable.  Patient denies drinking or using any illegal substances.  He is working at [employer].    Suicidal Ideation: No Plan Formed: No Patient has means to carry out plan: No  Homicidal Ideation: No Plan Formed: No Patient has means to carry out plan: No  Past Psychiatric History/Hospitalization(s) Patient endorses history of depression and ADD when he was in the school.  He did not finish college because of depression.   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  He is diagnosed with ADD in high school.  He had tried Vyvanse however he did not have good response.  Patient denies history of mania, psychosis, hallucination, PTSD or any OCD symptoms.   He had a good response with Prozac. Anxiety: No Bipolar Disorder: No Depression: Yes Mania: No Psychosis: No Schizophrenia: No Personality Disorder: No Hospitalization for psychiatric illness: No History of Electroconvulsive Shock Therapy: No Prior Suicide Attempts: No  Medical History; Patient has no active  medical problems.  He goes to [facility] needs.  ROS Psychiatric: Agitation: No Hallucination: No Depressed Mood: No Insomnia: Yes Hypersomnia: No Altered Concentration: No Feels Worthless: No Grandiose Ideas: No Belief In Special Powers: No New/Increased Substance Abuse: History of smoking marijuana. Compulsions: No  Neurologic: Headache: No Seizure: No Paresthesias: No    Outpatient Encounter Prescriptions as of 09/15/2014  Medication Sig  . FLUoxetine (PROZAC) 20 MG capsule Take 3 capsules (60 mg total) by mouth daily.  . hydrOXYzine (VISTARIL) 25 MG capsule Take 1 capsule (25 mg total) by mouth 3 (three) times daily as needed.  . methylphenidate 36 MG PO CR tablet Take 1 tablet (36 mg total) by mouth daily.  . [DISCONTINUED] FLUoxetine (PROZAC) 20 MG capsule Take 3 capsules (60 mg total) by mouth daily.  . [DISCONTINUED] methylphenidate 36 MG PO CR tablet Take 1 tablet (36 mg total) by mouth daily.  . [DISCONTINUED] methylphenidate 36 MG PO CR tablet Take 1 tablet (36 mg total) by mouth daily.   No facility-administered encounter medications on file as of 09/15/2014.    No results found for this or any previous visit (from the past 2160 hour(s)).    Physical Exam: Constitutional:  BP 141/73 mmHg  Pulse 75  Ht 6' (1.829 m)  Wt 255 lb 9.6 oz (115.939 kg)  BMI 34.66 kg/m2  Musculoskeletal: Strength & Muscle Tone: within normal limits Gait & Station: normal Patient leans: N/A  Mental Status  Examination;  Patient is casually dressed and groomed.  He is cooperative and maintained good eye contact.  He described his mood anxious and his affect is appropriate.  His speech is clear and coherent.  His thought process is logical and goal-directed.  His attention and concentration is fair.  He denies any auditory or visual hallucination.  He denies any active or passive suicidal thoughts and homicidal thought.  There were no flight of ideas or any  loose association.  There were no delusions, paranoia or any obsessive thoughts.  Psychomotor activity is slightly decreased.  His fund of knowledge is average.  There were no tremors or shakes.  He is alert and oriented x3.  His insight judgment and impulse control is okay.   Established Problem, Stable/Improving (1), Review of Psycho-Social Stressors (1), Review of Last Therapy Session (1), Review of Medication Regimen & Side Effects (2) and Review of New Medication or Change in Dosage (2)  Assessment: Axis I: Depressive disorder NOS, ADD  Axis II: Deferred  Axis III:  History reviewed. No pertinent past medical history.  Plan:  I will add low-dose Vistaril 25 mg at bedtime for insomnia .  Recommended to continue Prozac  60 mg daily and Concerta 36 mg daily.  He has no side effects.  I offer counseling but patient declined.  Discussed medication side effects and benefits.  Recommended to call us back if he feel worsening of the symptoms.  Discuss safety plan that anytime having active suicidal thoughts or homicidal thought but he need to call 911 or go to the local emergency room.  Follow-up in 3 months.   Dravyn Severs T., MD 09/15/2014

## 2014-09-18 ENCOUNTER — Telehealth (HOSPITAL_COMMUNITY): Payer: Self-pay | Admitting: *Deleted

## 2014-09-18 ENCOUNTER — Telehealth (HOSPITAL_COMMUNITY): Payer: Self-pay

## 2014-09-18 NOTE — Telephone Encounter (Signed)
Prior authorization received for Concerta. Called and spoke with Crystal who gave approval #[id number] good until August 2017. Pharmacy notified.

## 2014-12-16 ENCOUNTER — Encounter (HOSPITAL_COMMUNITY): Payer: Self-pay | Admitting: Psychiatry

## 2014-12-16 ENCOUNTER — Ambulatory Visit (INDEPENDENT_AMBULATORY_CARE_PROVIDER_SITE_OTHER): Payer: 59 | Admitting: Psychiatry

## 2014-12-16 VITALS — BP 134/84 | HR 82 | Ht 72.0 in | Wt 255.8 lb

## 2014-12-16 DIAGNOSIS — F988 Other specified behavioral and emotional disorders with onset usually occurring in childhood and adolescence: Secondary | ICD-10-CM

## 2014-12-16 DIAGNOSIS — F32A Depression, unspecified: Secondary | ICD-10-CM

## 2014-12-16 DIAGNOSIS — Z79899 Other long term (current) drug therapy: Secondary | ICD-10-CM

## 2014-12-16 DIAGNOSIS — F909 Attention-deficit hyperactivity disorder, unspecified type: Secondary | ICD-10-CM

## 2014-12-16 DIAGNOSIS — F329 Major depressive disorder, single episode, unspecified: Secondary | ICD-10-CM | POA: Diagnosis not present

## 2014-12-16 MED ORDER — METHYLPHENIDATE HCL ER (OSM) 36 MG PO TBCR: 36.0000 mg | EXTENDED_RELEASE_TABLET | Freq: Every day | ORAL | Status: DC

## 2014-12-16 MED ORDER — FLUOXETINE HCL 20 MG PO CAPS: 60.0000 mg | ORAL_CAPSULE | Freq: Every day | ORAL | Status: DC

## 2014-12-16 NOTE — Progress Notes (Signed)
[facility] [address] Progress Note   Nicholas Sanders [id number] 29 y.o.  12/16/2014 3:16 PM  Chief Complaint:  I am sleeping better with the medication.         History of Present Illness:  Nicholas Sanders came for her followup appointment.   He is taking Concerta and Prozac without any side effects.  We started him on low-dose Vistaril to help his sleep on the last visit and he sleeping better.  He takes Vistaril as needed.  He denies any irritability, anger, mood swing.  He is able to do multitasking.  He noticed improvement at work and now he is not thinking to switch his job.  He denies any agitation, anger, mood swing.  His appetite is okay.  His vitals are stable.  He has no rash, itching, shakes or any tremors.  Patient denies drinking or using any illegal substances.  His working at [employer] .  Suicidal Ideation: No Plan Formed: No Patient has means to carry out plan: No  Homicidal Ideation: No Plan Formed: No Patient has means to carry out plan: No  Past Psychiatric History/Hospitalization(s) Patient hashistory of depression and ADD when he was in the school.  He did not finish college because of depression.   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  He is diagnosed with ADD in high school.  He had tried Vyvanse however he did not have good response.  Patient denies history of mania, psychosis, hallucination, PTSD or any OCD symptoms.   He had a good response with Prozac. Anxiety: No Bipolar Disorder: No Depression: Yes Mania: No Psychosis: No Schizophrenia: No Personality Disorder: No Hospitalization for psychiatric illness: No History of Electroconvulsive Shock Therapy: No Prior Suicide Attempts: No  Medical History; Patient has no active medical problems.  He [facility].  ROS Psychiatric: Agitation: No Hallucination:  No Depressed Mood: No Insomnia: Yes Hypersomnia: No Altered Concentration: No Feels Worthless: No Grandiose Ideas: No Belief In Special Powers: No New/Increased Substance Abuse: History of smoking marijuana. Compulsions: No  Neurologic: Headache: No Seizure: No Paresthesias: No    Outpatient Encounter Prescriptions as of 12/16/2014  Medication Sig  . FLUoxetine (PROZAC) 20 MG capsule Take 3 capsules (60 mg total) by mouth daily.  . hydrOXYzine (VISTARIL) 25 MG capsule Take 1 capsule (25 mg total) by mouth 3 (three) times daily as needed.  . methylphenidate 36 MG PO CR tablet Take 1 tablet (36 mg total) by mouth daily.  . [DISCONTINUED] FLUoxetine (PROZAC) 20 MG capsule Take 3 capsules (60 mg total) by mouth daily.  . [DISCONTINUED] methylphenidate 36 MG PO CR tablet Take 1 tablet (36 mg total) by mouth daily.  . [DISCONTINUED] methylphenidate 36 MG PO CR tablet Take 1 tablet (36 mg total) by mouth daily.   No facility-administered encounter medications on file as of 12/16/2014.    No results found for this or any previous visit (from the past 2160 hour(s)).    Physical Exam: Constitutional:  BP 134/84 mmHg  Pulse 82  Ht 6' (1.829 m)  Wt 255 lb 12.8 oz (116.03 kg)  BMI 34.69 kg/m2  Musculoskeletal: Strength & Muscle Tone: within normal limits Gait & Station: normal Patient leans: N/A  Mental Status Examination;  Patient is casually dressed and groomed.  He is cooperative and maintained good eye contact.  He described his mood  euthymic and his affect is appropriate.  His speech is clear and coherent.  His thought process is logical and goal-directed.  His attention and concentration is fair.  He denies any auditory or visual hallucination.  He denies any active or passive suicidal thoughts and homicidal thought.  There were no flight of ideas or any loose association.  There were no delusions, paranoia or any obsessive thoughts.  Psychomotor activity is normal.  His fund  of knowledge is average.  There were no tremors or shakes.  He is alert and oriented x3.  His insight judgment and impulse control is okay.   Established Problem, Stable/Improving (1), Review of Psycho-Social Stressors (1), Review or order clinical lab tests (1), Review of Last Therapy Session (1) and Review of Medication Regimen & Side Effects (2)  Assessment: Axis I: Depressive disorder NOS, ADD  Axis II: Deferred  Axis III:  History reviewed. No pertinent past medical history.  Plan:  Patient doing better on his current medication.  He has no side effects.  Continue to take Vistaril 25 mg as needed for insomnia.  Continue Concerta 36 mg daily and Prozac 60 mg daily.  Patient has not done blood work in a while and we will order CBC, CMP and TSH.  Recommended to call us back if he has any question or any concern.  Follow-up in 3 months.  Nicholas Sanders T., MD 12/16/2014

## 2015-03-18 ENCOUNTER — Encounter (HOSPITAL_COMMUNITY): Payer: Self-pay | Admitting: Psychiatry

## 2015-03-18 ENCOUNTER — Ambulatory Visit (INDEPENDENT_AMBULATORY_CARE_PROVIDER_SITE_OTHER): Payer: 59 | Admitting: Psychiatry

## 2015-03-18 VITALS — BP 130/88 | HR 91 | Ht 72.0 in | Wt 260.8 lb

## 2015-03-18 DIAGNOSIS — F329 Major depressive disorder, single episode, unspecified: Secondary | ICD-10-CM

## 2015-03-18 DIAGNOSIS — Z79899 Other long term (current) drug therapy: Secondary | ICD-10-CM | POA: Diagnosis not present

## 2015-03-18 DIAGNOSIS — F909 Attention-deficit hyperactivity disorder, unspecified type: Secondary | ICD-10-CM

## 2015-03-18 DIAGNOSIS — F988 Other specified behavioral and emotional disorders with onset usually occurring in childhood and adolescence: Secondary | ICD-10-CM

## 2015-03-18 DIAGNOSIS — F32A Depression, unspecified: Secondary | ICD-10-CM

## 2015-03-18 MED ORDER — METHYLPHENIDATE HCL ER (OSM) 36 MG PO TBCR: 36.0000 mg | EXTENDED_RELEASE_TABLET | Freq: Every day | ORAL | Status: DC

## 2015-03-18 MED ORDER — FLUOXETINE HCL 20 MG PO CAPS: 60.0000 mg | ORAL_CAPSULE | Freq: Every day | ORAL | Status: DC

## 2015-03-18 NOTE — Progress Notes (Signed)
[facility] [id number] Progress Note   Nicholas Sanders [id number] 30 y.o.  03/18/2015 2:56 PM  Chief Complaint:  I forgot my blood work.  I'm taking my medication.  It is working good.           History of Present Illness:  Nicholas Sanders came for her followup appointment.   He apologizes for not having blood work but promised to do it before his next appointment.  He is taking his medication and reported no side effects.  He mentioned Christmas was very good and he had a good time.  He denies any irritability, anger, mood swing or any crying spells.  His attention and focus is good.  He is able to do multitasking.  He endorsed job is very busy but his medicine working very well.  Patient works at [employer] .  Patient denies drinking or using any illegal substances.  He sleeping good.  He denies any feeling of hopelessness or worthlessness.  He has not taken Vistaril in past 3 months.  His appetite is okay.  His vitals are stable.  Suicidal Ideation: No Plan Formed: No Patient has means to carry out plan: No  Homicidal Ideation: No Plan Formed: No Patient has means to carry out plan: No  Past Psychiatric History/Hospitalization(s) Patient hashistory of depression and ADD when he was in the school.  He did not finish college because of depression.   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  He is diagnosed with ADD in high school.  He had tried Vyvanse however he did not have good response.  Patient denies history of mania, psychosis, hallucination, PTSD or any OCD symptoms.   He had a good response with Prozac. Anxiety: No Bipolar Disorder: No Depression: Yes Mania: No Psychosis: No Schizophrenia: No Personality Disorder: No Hospitalization for psychiatric illness: No History of Electroconvulsive Shock Therapy: No Prior Suicide Attempts: No  Medical History; Patient has no active  medical problems.  He goes to [facility] for his health needs.  ROS Psychiatric: Agitation: No Hallucination: No Depressed Mood: No Insomnia: No Hypersomnia: No Altered Concentration: No Feels Worthless: No Grandiose Ideas: No Belief In Special Powers: No New/Increased Substance Abuse: History of smoking marijuana. Compulsions: No  Neurologic: Headache: No Seizure: No Paresthesias: No    Outpatient Encounter Prescriptions as of 03/18/2015  Medication Sig  . FLUoxetine (PROZAC) 20 MG capsule Take 3 capsules (60 mg total) by mouth daily.  . hydrOXYzine (VISTARIL) 25 MG capsule Take 1 capsule (25 mg total) by mouth 3 (three) times daily as needed.  . methylphenidate 36 MG PO CR tablet Take 1 tablet (36 mg total) by mouth daily.  . [DISCONTINUED] FLUoxetine (PROZAC) 20 MG capsule Take 3 capsules (60 mg total) by mouth daily.  . [DISCONTINUED] methylphenidate 36 MG PO CR tablet Take 1 tablet (36 mg total) by mouth daily.  . [DISCONTINUED] methylphenidate 36 MG PO CR tablet Take 1 tablet (36 mg total) by mouth daily.   No facility-administered encounter medications on file as of 03/18/2015.    No results found for this or any previous visit (from the past 2160 hour(s)).    Physical Exam: Constitutional:  BP 130/88 mmHg  Pulse 91  Ht 6' (1.829 m)  Wt 260 lb 12.8 oz (118.298 kg)  BMI 35.36 kg/m2  Musculoskeletal: Strength & Muscle Tone: within normal limits Gait & Station: normal  Patient leans: N/A  Mental Status Examination;  Patient is casually dressed and groomed.  He is cooperative and maintained good eye contact.  He described his mood euthymic and his affect is appropriate.  His speech is clear and coherent.  His thought process is logical and goal-directed.  His attention and concentration is fair.  He denies any auditory or visual hallucination.  He denies any active or passive suicidal thoughts and homicidal thought.  There were no flight of ideas or any  loose association.  There were no delusions, paranoia or any obsessive thoughts.  Psychomotor activity is normal.  His fund of knowledge is average.  There were no tremors or shakes.  He is alert and oriented x3.  His insight judgment and impulse control is okay.   Established Problem, Stable/Improving (1), Review of Psycho-Social Stressors (1), Review or order clinical lab tests (1), Review of Last Therapy Session (1) and Review of Medication Regimen & Side Effects (2)  Assessment: Axis I: Depressive disorder NOS, ADD  Axis II: Deferred  Axis III:  History reviewed. No pertinent past medical history.  Plan:  Reinforce blood work.  Patient is given a new prescription for blood work.  He is doing better on his current psychiatric medication.  I will continue Concerta 36 mg daily.  He usually does not take Concerta on weekends.  Continue Prozac 60 mg daily.  He has no tremors shakes or any EPS.  Recommended to call back if he has any question or any concern.  Follow-up in 3 months.  Zelia Yzaguirre T., MD 03/18/2015

## 2015-04-21 ENCOUNTER — Other Ambulatory Visit (HOSPITAL_COMMUNITY): Payer: Self-pay | Admitting: Psychiatry

## 2015-06-15 ENCOUNTER — Ambulatory Visit (HOSPITAL_COMMUNITY): Payer: Self-pay | Admitting: Psychiatry

## 2015-07-09 LAB — HEMOGLOBIN A1C
Hgb A1c MFr Bld: 5.3 % (ref <5.7)
MEAN PLASMA GLUCOSE: 105 mg/dL

## 2015-07-09 LAB — CBC WITH DIFFERENTIAL/PLATELET
BASOS ABS: 81 {cells}/uL (ref 0–200)
BASOS PCT: 1 %
Eosinophils Absolute: 243 cells/uL (ref 15–500)
Eosinophils Relative: 3 %
HCT: 45.4 % (ref 38.5–50.0)
Hemoglobin: 15.7 g/dL (ref 13.2–17.1)
LYMPHS ABS: 2754 {cells}/uL (ref 850–3900)
LYMPHS PCT: 34 %
MCH: 31.2 pg (ref 27.0–33.0)
MCHC: 34.6 g/dL (ref 32.0–36.0)
MCV: 90.3 fL (ref 80.0–100.0)
MONO ABS: 486 {cells}/uL (ref 200–950)
MONOS PCT: 6 %
MPV: 9.6 fL (ref 7.5–12.5)
NEUTROS PCT: 56 %
Neutro Abs: 4536 cells/uL (ref 1500–7800)
PLATELETS: 349 10*3/uL (ref 140–400)
RBC: 5.03 MIL/uL (ref 4.20–5.80)
RDW: 13.7 % (ref 11.0–15.0)
WBC: 8.1 10*3/uL (ref 3.8–10.8)

## 2015-07-10 LAB — COMPLETE METABOLIC PANEL WITH GFR
ALT: 29 U/L (ref 9–46)
AST: 23 U/L (ref 10–40)
Albumin: 4.6 g/dL (ref 3.6–5.1)
Alkaline Phosphatase: 44 U/L (ref 40–115)
BUN: 10 mg/dL (ref 7–25)
CHLORIDE: 102 mmol/L (ref 98–110)
CO2: 26 mmol/L (ref 20–31)
CREATININE: 0.99 mg/dL (ref 0.60–1.35)
Calcium: 9.7 mg/dL (ref 8.6–10.3)
GFR, Est Non African American: >89 mL/min (ref >=60)
Glucose, Bld: 77 mg/dL (ref 65–99)
POTASSIUM: 4.6 mmol/L (ref 3.5–5.3)
Sodium: 141 mmol/L (ref 135–146)
Total Bilirubin: 0.5 mg/dL (ref 0.2–1.2)
Total Protein: 7.2 g/dL (ref 6.1–8.1)

## 2015-07-10 LAB — TSH: TSH: 1.81 mIU/L (ref 0.40–4.50)

## 2015-07-13 ENCOUNTER — Ambulatory Visit (INDEPENDENT_AMBULATORY_CARE_PROVIDER_SITE_OTHER): Payer: 59 | Admitting: Psychiatry

## 2015-07-13 ENCOUNTER — Encounter (HOSPITAL_COMMUNITY): Payer: Self-pay | Admitting: Psychiatry

## 2015-07-13 VITALS — BP 138/78 | HR 76 | Ht 72.0 in | Wt 256.0 lb

## 2015-07-13 DIAGNOSIS — F329 Major depressive disorder, single episode, unspecified: Secondary | ICD-10-CM | POA: Diagnosis not present

## 2015-07-13 DIAGNOSIS — F909 Attention-deficit hyperactivity disorder, unspecified type: Secondary | ICD-10-CM | POA: Diagnosis not present

## 2015-07-13 DIAGNOSIS — F32A Depression, unspecified: Secondary | ICD-10-CM

## 2015-07-13 DIAGNOSIS — F988 Other specified behavioral and emotional disorders with onset usually occurring in childhood and adolescence: Secondary | ICD-10-CM

## 2015-07-13 MED ORDER — METHYLPHENIDATE HCL ER (OSM) 36 MG PO TBCR: 36.0000 mg | EXTENDED_RELEASE_TABLET | Freq: Every day | ORAL | Status: DC

## 2015-07-13 MED ORDER — METHYLPHENIDATE HCL ER (OSM) 36 MG PO TBCR
36.0000 mg | EXTENDED_RELEASE_TABLET | Freq: Every day | ORAL | Status: DC
Start: 2015-07-13 — End: 2015-07-13

## 2015-07-13 MED ORDER — FLUOXETINE HCL 20 MG PO CAPS: 60.0000 mg | ORAL_CAPSULE | Freq: Every day | ORAL | Status: DC

## 2015-07-13 NOTE — Progress Notes (Signed)
[facility] Progress Note   Nicholas Sanders [id number] 29 y.o.  07/13/2015 3:06 PM  Chief Complaint:  Medication management and follow-up.             History of Present Illness:  Nicholas Sanders came for her followup appointment.  He is taking his medication as prescribed but does not take on the weekend.  His attention, focus and multitasking is good.  He takes Prozac every day including weekends.  He denies any irritability, anger, mood swing.  Recently he was stressed out because his mother relapsed into drinking and she was in [facility] program.  Patient told things are getting better now.  He is doing very well at job.  He works at [employer].  He denies any early use or refills for his stimulant.  He does not take on the weekends.  He has no tremors, shakes, EPS or any side effects.  He does not drink alcohol or use any illegal substances.  His appetite is okay.  His energy level is good.  His sleep is fine.  His vitals are stable.  He had blood work which is normal.  He TSH is normal and his hemoglobin A1c is 5.3.  His CBC and basic chemistry is also normal.  Suicidal Ideation: No Plan Formed: No Patient has means to carry out plan: No  Homicidal Ideation: No Plan Formed: No Patient has means to carry out plan: No  Past Psychiatric History/Hospitalization(s) Patient hashistory of depression and ADD when he was in the school.  He did not finish college because of depression.   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  He is diagnosed with ADD in high school.  He had tried Vyvanse however he did not have good response.  Patient denies history of mania, psychosis, hallucination, PTSD or any OCD symptoms.   He had a good response with Prozac. Anxiety: No Bipolar Disorder: No Depression: Yes Mania: No Psychosis: No Schizophrenia: No Personality Disorder: No Hospitalization for  psychiatric illness: No History of Electroconvulsive Shock Therapy: No Prior Suicide Attempts: No  Medical History; Patient has no active medical problems.  He goes to [facility] for his health needs.  ROS Psychiatric: Agitation: No Hallucination: No Depressed Mood: No Insomnia: No Hypersomnia: No Altered Concentration: No Feels Worthless: No Grandiose Ideas: No Belief In Special Powers: No New/Increased Substance Abuse: History of smoking marijuana. Compulsions: No  Neurologic: Headache: No Seizure: No Paresthesias: No    Outpatient Encounter Prescriptions as of 07/13/2015  Medication Sig  . FLUoxetine (PROZAC) 20 MG capsule Take 3 capsules (60 mg total) by mouth daily.  . methylphenidate 36 MG PO CR tablet Take 1 tablet (36 mg total) by mouth daily.  . [DISCONTINUED] FLUoxetine (PROZAC) 20 MG capsule Take 3 capsules (60 mg total) by mouth daily.  . [DISCONTINUED] hydrOXYzine (VISTARIL) 25 MG capsule Take 1 capsule (25 mg total) by mouth 3 (three) times daily as needed.  . [DISCONTINUED] methylphenidate 36 MG PO CR tablet Take 1 tablet (36 mg total) by mouth daily.  . [DISCONTINUED] methylphenidate 36 MG PO CR tablet Take 1 tablet (36 mg total) by mouth daily.   No facility-administered encounter medications on file as of 07/13/2015.    Recent Results (from the past 2160 hour(s))  TSH     Status: None   Collection Time: 07/09/15  4:10 PM  Result  Value Ref Range   TSH 1.81 0.40 - 4.50 mIU/L  CBC with Differential/Platelet     Status: None   Collection Time: 07/09/15  4:10 PM  Result Value Ref Range   WBC 8.1 3.8 - 10.8 K/uL   RBC 5.03 4.20 - 5.80 MIL/uL   Hemoglobin 15.7 13.2 - 17.1 g/dL   HCT 45.4 38.5 - 50.0 %   MCV 90.3 80.0 - 100.0 fL   MCH 31.2 27.0 - 33.0 pg   MCHC 34.6 32.0 - 36.0 g/dL   RDW 13.7 11.0 - 15.0 %   Platelets 349 140 - 400 K/uL   MPV 9.6 7.5 - 12.5 fL   Neutro Abs 4536 1500 - 7800 cells/uL   Lymphs Abs 2754 850 - 3900 cells/uL    Monocytes Absolute 486 200 - 950 cells/uL   Eosinophils Absolute 243 15 - 500 cells/uL   Basophils Absolute 81 0 - 200 cells/uL   Neutrophils Relative % 56 %   Lymphocytes Relative 34 %   Monocytes Relative 6 %   Eosinophils Relative 3 %   Basophils Relative 1 %   Smear Review Criteria for review not met     Comment: ** Please note change in unit of measure and reference range(s). **  COMPLETE METABOLIC PANEL WITH GFR     Status: None   Collection Time: 07/09/15  4:10 PM  Result Value Ref Range   Sodium 141 135 - 146 mmol/L   Potassium 4.6 3.5 - 5.3 mmol/L   Chloride 102 98 - 110 mmol/L   CO2 26 20 - 31 mmol/L   Glucose, Bld 77 65 - 99 mg/dL   BUN 10 7 - 25 mg/dL   Creat 0.99 0.60 - 1.35 mg/dL   Total Bilirubin 0.5 0.2 - 1.2 mg/dL   Alkaline Phosphatase 44 40 - 115 U/L   AST 23 10 - 40 U/L   ALT 29 9 - 46 U/L   Total Protein 7.2 6.1 - 8.1 g/dL   Albumin 4.6 3.6 - 5.1 g/dL   Calcium 9.7 8.6 - 10.3 mg/dL   GFR, Est African American >89 >=60 mL/min   GFR, Est Non African American >89 >=60 mL/min    Comment:   The estimated GFR is a calculation valid for adults (>=98 years old) that uses the CKD-EPI algorithm to adjust for age and sex. It is   not to be used for children, pregnant women, hospitalized patients,    patients on dialysis, or with rapidly changing kidney function. According to the NKDEP, eGFR >89 is normal, 60-89 shows mild impairment, 30-59 shows moderate impairment, 15-29 shows severe impairment and <15 is ESRD.     Hemoglobin A1c     Status: None   Collection Time: 07/09/15  4:10 PM  Result Value Ref Range   Hgb A1c MFr Bld 5.3 <5.7 %    Comment:   For the purpose of screening for the presence of diabetes:   <5.7%       Consistent with the absence of diabetes 5.7-6.4 %   Consistent with increased risk for diabetes (prediabetes) >=6.5 %     Consistent with diabetes   This assay result is consistent with a decreased risk of diabetes.   Currently, no  consensus exists regarding use of hemoglobin A1c for diagnosis of diabetes in children.   According to American Diabetes Association (ADA) guidelines, hemoglobin A1c <7.0% represents optimal control in non-pregnant diabetic patients. Different metrics may apply to specific patient populations. Standards  of Medical Care in Diabetes (ADA).      Mean Plasma Glucose 105 mg/dL      Physical Exam: Constitutional:  BP 138/78 mmHg  Pulse 76  Ht 6' (1.829 m)  Wt 256 lb (116.121 kg)  BMI 34.71 kg/m2  Musculoskeletal: Strength & Muscle Tone: within normal limits Gait & Station: normal Patient leans: N/A  Mental Status Examination;  Patient is casually dressed and groomed.  He is cooperative and maintained good eye contact.  He described his mood euthymic and his affect is appropriate.  His speech is clear and coherent.  His thought process is logical and goal-directed.  His attention and concentration is fair.  He denies any auditory or visual hallucination.  He denies any active or passive suicidal thoughts and homicidal thought.  There were no flight of ideas or any loose association.  There were no delusions, paranoia or any obsessive thoughts.  Psychomotor activity is normal.  His fund of knowledge is average.  There were no tremors or shakes.  He is alert and oriented x3.  His insight judgment and impulse control is okay.   Established Problem, Stable/Improving (1), Review of Psycho-Social Stressors (1), Review or order clinical lab tests (1), Review of Last Therapy Session (1) and Review of Medication Regimen & Side Effects (2)  Assessment: Axis I: Depressive disorder NOS, ADD  Axis II: Deferred  Axis III:  History reviewed. No pertinent past medical history.  Plan:  I review his blood work which is normal.  Discusses psychosocial stressors.  Patient is stable on his current psychiatric medication.  I will continue Concerta 36 mg daily and Prozac 60 mg daily.  Discussed  medication side effects and benefits. Recommended to call back if he has any question or any concern.  Follow-up in 3 months.  Ravinder Hofland T., MD 07/13/2015

## 2015-08-07 ENCOUNTER — Other Ambulatory Visit (HOSPITAL_COMMUNITY): Payer: Self-pay | Admitting: Psychiatry

## 2015-10-13 ENCOUNTER — Ambulatory Visit (HOSPITAL_COMMUNITY): Payer: Self-pay | Admitting: Psychiatry

## 2015-10-27 ENCOUNTER — Other Ambulatory Visit (HOSPITAL_COMMUNITY): Payer: Self-pay

## 2015-10-27 ENCOUNTER — Telehealth (HOSPITAL_COMMUNITY): Payer: Self-pay

## 2015-10-27 DIAGNOSIS — F329 Major depressive disorder, single episode, unspecified: Secondary | ICD-10-CM

## 2015-10-27 DIAGNOSIS — F32A Depression, unspecified: Secondary | ICD-10-CM

## 2015-10-27 DIAGNOSIS — F988 Other specified behavioral and emotional disorders with onset usually occurring in childhood and adolescence: Secondary | ICD-10-CM

## 2015-10-27 MED ORDER — METHYLPHENIDATE HCL ER (OSM) 36 MG PO TBCR: 36.0000 mg | EXTENDED_RELEASE_TABLET | Freq: Every day | ORAL | 0 refills | Status: DC

## 2015-10-27 MED ORDER — FLUOXETINE HCL 20 MG PO CAPS: 60.0000 mg | ORAL_CAPSULE | Freq: Every day | ORAL | 2 refills | Status: DC

## 2015-10-27 NOTE — Telephone Encounter (Signed)
10/27/15 4:43pm Pt came to pick-up rx script [id number].[name].Nicholas Sanders/sh

## 2015-11-02 ENCOUNTER — Telehealth (HOSPITAL_COMMUNITY): Payer: Self-pay | Admitting: *Deleted

## 2015-11-02 NOTE — Telephone Encounter (Signed)
Prior authorization for Concerta received. Attempted to submit on cover my meds but it would not pull patient up. States error in postal code, will call [facility] during business hours for authorization.

## 2015-11-04 ENCOUNTER — Telehealth (HOSPITAL_COMMUNITY): Payer: Self-pay | Admitting: *Deleted

## 2015-11-04 NOTE — Telephone Encounter (Signed)
Called [phone] per cover my meds for prior authorization of Concerta. Spoke with Trinna PostAlex who gave approval #[id number] until 11/03/16. Called to notify pharmacy.

## 2015-11-30 ENCOUNTER — Other Ambulatory Visit (HOSPITAL_COMMUNITY): Payer: Self-pay | Admitting: Psychiatry

## 2015-11-30 DIAGNOSIS — F32A Depression, unspecified: Secondary | ICD-10-CM

## 2015-11-30 DIAGNOSIS — F329 Major depressive disorder, single episode, unspecified: Secondary | ICD-10-CM

## 2015-12-27 ENCOUNTER — Telehealth (HOSPITAL_COMMUNITY): Payer: Self-pay

## 2015-12-27 NOTE — Telephone Encounter (Signed)
Patient was supposed to come in tomorrow, however he started a new job and is unable to get the day off, he has rescheduled for January but will need a refill on his Concerta. Please review and advise, thank you

## 2015-12-28 ENCOUNTER — Other Ambulatory Visit (HOSPITAL_COMMUNITY): Payer: Self-pay | Admitting: Psychiatry

## 2015-12-28 ENCOUNTER — Ambulatory Visit (HOSPITAL_COMMUNITY): Payer: Self-pay | Admitting: Psychiatry

## 2015-12-28 DIAGNOSIS — F329 Major depressive disorder, single episode, unspecified: Secondary | ICD-10-CM

## 2015-12-28 DIAGNOSIS — F32A Depression, unspecified: Secondary | ICD-10-CM

## 2015-12-28 DIAGNOSIS — F900 Attention-deficit hyperactivity disorder, predominantly inattentive type: Secondary | ICD-10-CM

## 2015-12-28 MED ORDER — FLUOXETINE HCL 20 MG PO CAPS: 60.0000 mg | ORAL_CAPSULE | Freq: Every day | ORAL | 1 refills | Status: DC

## 2015-12-28 MED ORDER — METHYLPHENIDATE HCL ER (OSM) 36 MG PO TBCR: 36.0000 mg | EXTENDED_RELEASE_TABLET | Freq: Every day | ORAL | 0 refills | Status: DC

## 2015-12-28 NOTE — Telephone Encounter (Signed)
I tried to call patient to let him know his prescription was ready for pick up and the number is disconnected, no other number is available.

## 2016-02-29 ENCOUNTER — Ambulatory Visit (HOSPITAL_COMMUNITY): Payer: Self-pay | Admitting: Psychiatry

## 2016-03-21 ENCOUNTER — Encounter (HOSPITAL_COMMUNITY): Payer: Self-pay | Admitting: Psychiatry

## 2016-03-21 ENCOUNTER — Ambulatory Visit (INDEPENDENT_AMBULATORY_CARE_PROVIDER_SITE_OTHER): Payer: 59 | Admitting: Psychiatry

## 2016-03-21 DIAGNOSIS — F900 Attention-deficit hyperactivity disorder, predominantly inattentive type: Secondary | ICD-10-CM

## 2016-03-21 DIAGNOSIS — Z79899 Other long term (current) drug therapy: Secondary | ICD-10-CM | POA: Diagnosis not present

## 2016-03-21 DIAGNOSIS — F32A Depression, unspecified: Secondary | ICD-10-CM

## 2016-03-21 DIAGNOSIS — F329 Major depressive disorder, single episode, unspecified: Secondary | ICD-10-CM

## 2016-03-21 MED ORDER — METHYLPHENIDATE HCL ER (OSM) 36 MG PO TBCR: 36.0000 mg | EXTENDED_RELEASE_TABLET | Freq: Every day | ORAL | 0 refills | Status: DC

## 2016-03-21 MED ORDER — FLUOXETINE HCL 20 MG PO CAPS: 60.0000 mg | ORAL_CAPSULE | Freq: Every day | ORAL | 2 refills | Status: AC

## 2016-03-21 MED ORDER — METHYLPHENIDATE HCL ER (OSM) 36 MG PO TBCR: 36.0000 mg | EXTENDED_RELEASE_TABLET | Freq: Every day | ORAL | 0 refills | Status: AC

## 2016-03-21 NOTE — Progress Notes (Signed)
BH MD/PA/NP OP Progress Note  03/21/2016 4:23 PM Nicholas Sanders  MRN:  [id number]  Chief Complaint:  Subjective:  I have difficulty focusing.  I'm out of Concerta the past 2 months.  HPI: Nicholas Sanders came for his follow-up appointment.  He is taking Prozac but out of his Concerta and admitted poor attention, poor concentration and difficulty multitasking.  His depression is a stable.  He denies any anger, mania, psychosis or any hallucination.  His job is going very well.  He has no tremors, shakes or any EPS.  Patient denies drinking alcohol or using any illegal substances.  He wants to continue Prozac.  His energy level is good.  His vital signs are stable .  He gained weight because sometime he has no energy and lack of motivation.    Visit Diagnosis:    ICD-9-CM ICD-10-CM   1. 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 Depressive disorder 311 F32.9 FLUoxetine (PROZAC) 20 MG capsule    Past Psychiatric History: Reviewed.  Past Medical History: No past medical history on file. No past surgical history on file.  Family Psychiatric History: Reviewed.  Family History:  Family History  Problem Relation Age of Onset  . ADD / ADHD Mother   . Alcohol abuse Mother   . ADD / ADHD Father   . ADD / ADHD Sister     Social History:  Social History   Social History  . Marital status: Single    Spouse name: N/A  . Number of children: N/A  . Years of education: N/A   Social History Main Topics  . Smoking status: Never Smoker  . Smokeless tobacco: Never Used  . Alcohol use No  . Drug use: No  . Sexual activity: Not on file   Other Topics Concern  . Not on file   Social History Narrative  . No narrative on file    Allergies: No Known Allergies  Metabolic Disorder Labs: Lab Results  Component Value Date   HGBA1C 5.3 07/09/2015   MPG 105  07/09/2015   MPG 111 10/08/2013   No results found for: PROLACTIN Lab Results  Component Value Date   CHOL 200 10/08/2013   TRIG 137 10/08/2013   HDL 53 10/08/2013   CHOLHDL 3.8 10/08/2013   VLDL 27 10/08/2013   LDLCALC 120 (H) 10/08/2013     Current Medications: Current Outpatient Prescriptions  Medication Sig Dispense Refill  . FLUoxetine (PROZAC) 20 MG capsule Take 3 capsules (60 mg total) by mouth daily. 90 capsule 2  . methylphenidate 36 MG PO CR tablet Take 1 tablet (36 mg total) by mouth daily. Do not refill until 04/20/16 30 tablet 0   No current facility-administered medications for this visit.     Neurologic: Headache: No Seizure: No Paresthesias: No  Musculoskeletal: Strength & Muscle Tone: within normal limits Gait & Station: normal Patient leans: N/A  Psychiatric Specialty Exam: Review of Systems  Constitutional: Negative for weight loss.  HENT: Negative.   Musculoskeletal: Negative.   Skin: Negative for itching and rash.    Blood pressure 122/72, pulse 74, height 6' (1.829 m), weight 265 lb (120.2 kg).Body mass index is 35.94 kg/m.  General Appearance: Casual and Fairly Groomed  Eye Contact:  Good  Speech:  Clear and Coherent  Volume:  Normal  Mood:  Euthymic  Affect:  Congruent  Thought Process:  Goal Directed  Orientation:  Full (Time, Place, and Person)  Thought Content: WDL and Logical   Suicidal Thoughts:  No  Homicidal Thoughts:  No  Memory:  Immediate;   Good Recent;   Good Remote;   Good  Judgement:  Good  Insight:  Good  Psychomotor Activity:  Normal  Concentration:  Concentration: Good and Attention Span: Good  Recall:  Good  Fund of Knowledge: Good  Language: Good  Akathisia:  No  Handed:  Right  AIMS (if indicated):  0  Assets:  Communication Skills Desire for Improvement Financial Resources/Insurance Housing Leisure Time Physical Health Resilience Social Support Talents/Skills  ADL's:  Intact  Cognition: WNL  Sleep:   ok   Assessment: Depressive disorder, recurrent mild.  Attention deficit disorder inattentive type.  Plan: Restart Concerta 36 mg daily to help his focus attention and multitasking.  Continue Prozac 60 mg daily.  Discussed medication side effects and benefits.  Patient does not ask for early refills for his stimulants.  Recommended to call us back if he has any question, concern or worsening of the symptom.  Follow-up in things.  Latishia Suitt T., MD 03/21/2016, 4:23 PM

## 2016-06-20 ENCOUNTER — Ambulatory Visit (HOSPITAL_COMMUNITY): Payer: Self-pay | Admitting: Psychiatry

## 2017-06-08 ENCOUNTER — Emergency Department (HOSPITAL_COMMUNITY): Admission: EM | Admit: 2017-06-08 | Discharge: 2017-06-08 | Discharge status: Against Medical Advice | Payer: Self-pay

## 2023-04-03 ENCOUNTER — Other Ambulatory Visit (HOSPITAL_BASED_OUTPATIENT_CLINIC_OR_DEPARTMENT_OTHER): Payer: Self-pay

## 2023-04-03 MED ORDER — LISDEXAMFETAMINE DIMESYLATE 20 MG PO CHEW
20.0000 mg | CHEWABLE_TABLET | Freq: Every morning | ORAL | 0 refills | Status: AC
  Filled 2023-04-03: qty 30, 30d supply, fill #0

## 2023-05-03 ENCOUNTER — Other Ambulatory Visit (HOSPITAL_BASED_OUTPATIENT_CLINIC_OR_DEPARTMENT_OTHER): Payer: Self-pay

## 2023-05-03 MED ORDER — LISDEXAMFETAMINE DIMESYLATE 40 MG PO CHEW
40.0000 mg | CHEWABLE_TABLET | Freq: Every morning | ORAL | 0 refills | Status: DC
Start: 2023-05-03 — End: 2023-05-04
  Filled 2023-05-03: qty 60, 30d supply, fill #0

## 2023-05-04 ENCOUNTER — Other Ambulatory Visit (HOSPITAL_BASED_OUTPATIENT_CLINIC_OR_DEPARTMENT_OTHER): Payer: Self-pay

## 2023-05-04 ENCOUNTER — Other Ambulatory Visit: Payer: Self-pay

## 2023-05-04 MED ORDER — LISDEXAMFETAMINE DIMESYLATE 40 MG PO CHEW
1.0000 | CHEWABLE_TABLET | Freq: Every morning | ORAL | 0 refills | Status: AC
  Filled 2023-05-04: qty 30, 30d supply, fill #0

## 2023-06-05 ENCOUNTER — Other Ambulatory Visit (HOSPITAL_BASED_OUTPATIENT_CLINIC_OR_DEPARTMENT_OTHER): Payer: Self-pay

## 2023-06-05 MED ORDER — LISDEXAMFETAMINE DIMESYLATE 40 MG PO CHEW
1.0000 | CHEWABLE_TABLET | Freq: Every morning | ORAL | 0 refills | Status: DC
Start: 2023-06-05 — End: 2023-07-05
  Filled 2023-06-05: qty 30, 30d supply, fill #0

## 2023-07-05 ENCOUNTER — Other Ambulatory Visit: Payer: Self-pay

## 2023-07-05 ENCOUNTER — Other Ambulatory Visit (HOSPITAL_BASED_OUTPATIENT_CLINIC_OR_DEPARTMENT_OTHER): Payer: Self-pay

## 2023-07-05 MED ORDER — LISDEXAMFETAMINE DIMESYLATE 40 MG PO CHEW
40.0000 mg | CHEWABLE_TABLET | Freq: Every morning | ORAL | 0 refills | Status: DC
  Filled 2023-07-05: qty 30, 30d supply, fill #0

## 2023-07-23 ENCOUNTER — Other Ambulatory Visit (HOSPITAL_BASED_OUTPATIENT_CLINIC_OR_DEPARTMENT_OTHER): Payer: Self-pay

## 2023-07-23 MED ORDER — AMPHETAMINE-DEXTROAMPHETAMINE 10 MG PO TABS
10.0000 mg | ORAL_TABLET | Freq: Every day | ORAL | 0 refills | Status: DC
  Filled 2023-07-23: qty 30, 30d supply, fill #0

## 2023-08-06 ENCOUNTER — Other Ambulatory Visit (HOSPITAL_BASED_OUTPATIENT_CLINIC_OR_DEPARTMENT_OTHER): Payer: Self-pay

## 2023-08-06 MED ORDER — LISDEXAMFETAMINE DIMESYLATE 40 MG PO CHEW
40.0000 mg | CHEWABLE_TABLET | Freq: Every morning | ORAL | 0 refills | Status: AC
  Filled 2023-08-06: qty 30, 30d supply, fill #0

## 2023-08-07 ENCOUNTER — Other Ambulatory Visit (HOSPITAL_BASED_OUTPATIENT_CLINIC_OR_DEPARTMENT_OTHER): Payer: Self-pay

## 2023-09-03 ENCOUNTER — Other Ambulatory Visit (HOSPITAL_BASED_OUTPATIENT_CLINIC_OR_DEPARTMENT_OTHER): Payer: Self-pay

## 2023-09-03 ENCOUNTER — Other Ambulatory Visit: Payer: Self-pay

## 2023-09-03 MED ORDER — AMPHETAMINE-DEXTROAMPHETAMINE 10 MG PO TABS
10.0000 mg | ORAL_TABLET | Freq: Every day | ORAL | 0 refills | Status: DC
  Filled 2023-09-03: qty 30, 30d supply, fill #0

## 2023-09-03 MED ORDER — LISDEXAMFETAMINE DIMESYLATE 40 MG PO CHEW
1.0000 | CHEWABLE_TABLET | Freq: Every morning | ORAL | 0 refills | Status: DC
  Filled 2023-09-03 (×3): qty 30, 30d supply, fill #0

## 2023-10-02 ENCOUNTER — Other Ambulatory Visit (HOSPITAL_BASED_OUTPATIENT_CLINIC_OR_DEPARTMENT_OTHER): Payer: Self-pay

## 2023-10-02 ENCOUNTER — Other Ambulatory Visit: Payer: Self-pay

## 2023-10-02 MED ORDER — LISDEXAMFETAMINE DIMESYLATE 40 MG PO CHEW
40.0000 mg | CHEWABLE_TABLET | Freq: Every morning | ORAL | 0 refills | Status: DC
  Filled 2023-10-02: qty 30, 30d supply, fill #0

## 2023-10-02 MED ORDER — AMPHETAMINE-DEXTROAMPHETAMINE 10 MG PO TABS
10.0000 mg | ORAL_TABLET | Freq: Every day | ORAL | 0 refills | Status: DC
  Filled 2023-10-02: qty 30, 30d supply, fill #0

## 2023-10-03 ENCOUNTER — Other Ambulatory Visit: Payer: Self-pay

## 2023-10-30 ENCOUNTER — Other Ambulatory Visit (HOSPITAL_BASED_OUTPATIENT_CLINIC_OR_DEPARTMENT_OTHER): Payer: Self-pay

## 2023-10-30 MED ORDER — LISDEXAMFETAMINE DIMESYLATE 40 MG PO CHEW
40.0000 mg | CHEWABLE_TABLET | Freq: Every morning | ORAL | 0 refills | Status: DC
  Filled 2023-10-30 – 2023-11-01 (×2): qty 30, 30d supply, fill #0

## 2023-10-30 MED ORDER — AMPHETAMINE-DEXTROAMPHETAMINE 10 MG PO TABS
10.0000 mg | ORAL_TABLET | Freq: Every day | ORAL | 0 refills | Status: DC
  Filled 2023-10-30: qty 30, 30d supply, fill #0

## 2023-11-01 ENCOUNTER — Other Ambulatory Visit (HOSPITAL_BASED_OUTPATIENT_CLINIC_OR_DEPARTMENT_OTHER): Payer: Self-pay

## 2023-11-02 ENCOUNTER — Other Ambulatory Visit (HOSPITAL_BASED_OUTPATIENT_CLINIC_OR_DEPARTMENT_OTHER): Payer: Self-pay

## 2023-12-03 ENCOUNTER — Other Ambulatory Visit (HOSPITAL_BASED_OUTPATIENT_CLINIC_OR_DEPARTMENT_OTHER): Payer: Self-pay

## 2023-12-03 MED ORDER — AMPHETAMINE-DEXTROAMPHETAMINE 10 MG PO TABS
10.0000 mg | ORAL_TABLET | Freq: Every day | ORAL | 0 refills | Status: DC
  Filled 2023-12-03: qty 30, 30d supply, fill #0

## 2023-12-03 MED ORDER — LISDEXAMFETAMINE DIMESYLATE 40 MG PO CHEW
1.0000 | CHEWABLE_TABLET | Freq: Every morning | ORAL | 0 refills | Status: DC
  Filled 2023-12-03: qty 30, 30d supply, fill #0

## 2023-12-04 ENCOUNTER — Other Ambulatory Visit: Payer: Self-pay

## 2023-12-04 ENCOUNTER — Other Ambulatory Visit (HOSPITAL_BASED_OUTPATIENT_CLINIC_OR_DEPARTMENT_OTHER): Payer: Self-pay

## 2024-01-01 ENCOUNTER — Other Ambulatory Visit (HOSPITAL_BASED_OUTPATIENT_CLINIC_OR_DEPARTMENT_OTHER): Payer: Self-pay

## 2024-01-01 ENCOUNTER — Other Ambulatory Visit: Payer: Self-pay

## 2024-01-01 MED ORDER — LISDEXAMFETAMINE DIMESYLATE 40 MG PO CHEW
1.0000 | CHEWABLE_TABLET | Freq: Every morning | ORAL | 0 refills | Status: DC
  Filled 2024-01-01: qty 30, 30d supply, fill #0

## 2024-01-01 MED ORDER — AMPHETAMINE-DEXTROAMPHETAMINE 10 MG PO TABS
10.0000 mg | ORAL_TABLET | Freq: Every day | ORAL | 0 refills | Status: DC
  Filled 2024-01-01: qty 30, 30d supply, fill #0

## 2024-01-02 ENCOUNTER — Other Ambulatory Visit (HOSPITAL_BASED_OUTPATIENT_CLINIC_OR_DEPARTMENT_OTHER): Payer: Self-pay

## 2024-02-04 ENCOUNTER — Other Ambulatory Visit (HOSPITAL_BASED_OUTPATIENT_CLINIC_OR_DEPARTMENT_OTHER): Payer: Self-pay

## 2024-02-04 MED ORDER — AMPHETAMINE-DEXTROAMPHETAMINE 10 MG PO TABS
10.0000 mg | ORAL_TABLET | Freq: Every day | ORAL | 0 refills | Status: DC
  Filled 2024-02-04: qty 30, 30d supply, fill #0

## 2024-02-04 MED ORDER — LISDEXAMFETAMINE DIMESYLATE 40 MG PO CHEW
40.0000 mg | CHEWABLE_TABLET | Freq: Every morning | ORAL | 0 refills | Status: AC
  Filled 2024-02-04: qty 30, 30d supply, fill #0

## 2024-03-05 ENCOUNTER — Other Ambulatory Visit (HOSPITAL_BASED_OUTPATIENT_CLINIC_OR_DEPARTMENT_OTHER): Payer: Self-pay

## 2024-03-05 MED ORDER — LISDEXAMFETAMINE DIMESYLATE 60 MG PO CAPS
60.0000 mg | ORAL_CAPSULE | Freq: Every morning | ORAL | 0 refills | Status: AC
  Filled 2024-03-05: qty 30, 30d supply, fill #0

## 2024-03-05 MED ORDER — AMPHETAMINE-DEXTROAMPHETAMINE 10 MG PO TABS
10.0000 mg | ORAL_TABLET | Freq: Every day | ORAL | 0 refills | Status: AC
  Filled 2024-03-05: qty 30, 30d supply, fill #0
